# Patient Record
Sex: Female | Born: 1950 | Race: White | Hispanic: No | Smoking: Never smoker
Health system: Southern US, Community
[De-identification: ages and names within clinical notes are randomized; demographics above are authoritative.]

## PROBLEM LIST (undated history)

## (undated) DIAGNOSIS — K219 Gastro-esophageal reflux disease without esophagitis: Secondary | ICD-10-CM

## (undated) DIAGNOSIS — F0280 Dementia in other diseases classified elsewhere without behavioral disturbance: Secondary | ICD-10-CM

## (undated) DIAGNOSIS — R519 Headache, unspecified: Secondary | ICD-10-CM

## (undated) DIAGNOSIS — G309 Alzheimer's disease, unspecified: Secondary | ICD-10-CM

## (undated) DIAGNOSIS — R51 Headache: Secondary | ICD-10-CM

## (undated) HISTORY — DX: Gastro-esophageal reflux disease without esophagitis: K21.9

## (undated) HISTORY — DX: Headache: R51

## (undated) HISTORY — DX: Headache, unspecified: R51.9

---

## 2006-04-26 ENCOUNTER — Ambulatory Visit: Payer: Self-pay | Admitting: Pulmonary Disease

## 2006-05-20 ENCOUNTER — Ambulatory Visit: Payer: Self-pay

## 2016-12-04 ENCOUNTER — Encounter (HOSPITAL_COMMUNITY): Payer: Self-pay

## 2016-12-04 ENCOUNTER — Emergency Department (HOSPITAL_COMMUNITY)
Admission: EM | Admit: 2016-12-04 | Discharge: 2016-12-05 | Disposition: A | Discharge status: Home / Self Care | Payer: Self-pay | Attending: Emergency Medicine | Admitting: Emergency Medicine

## 2016-12-04 DIAGNOSIS — F0281 Dementia in other diseases classified elsewhere with behavioral disturbance: Secondary | ICD-10-CM | POA: Insufficient documentation

## 2016-12-04 DIAGNOSIS — K920 Hematemesis: Secondary | ICD-10-CM | POA: Insufficient documentation

## 2016-12-04 DIAGNOSIS — G309 Alzheimer's disease, unspecified: Secondary | ICD-10-CM | POA: Insufficient documentation

## 2016-12-04 HISTORY — DX: Dementia in other diseases classified elsewhere, unspecified severity, without behavioral disturbance, psychotic disturbance, mood disturbance, and anxiety: F02.80

## 2016-12-04 HISTORY — DX: Alzheimer's disease, unspecified: G30.9

## 2016-12-04 LAB — POC OCCULT BLOOD, ED: Fecal Occult Bld: NEGATIVE

## 2016-12-04 LAB — CBC WITH DIFFERENTIAL/PLATELET
BASOS ABS: 0 10*3/uL (ref 0.0–0.1)
Basophils Relative: 0 %
Eosinophils Absolute: 0.1 10*3/uL (ref 0.0–0.7)
Eosinophils Relative: 1 %
HCT: 35.7 % — ABNORMAL LOW (ref 36.0–46.0)
Hemoglobin: 12.4 g/dL (ref 12.0–15.0)
LYMPHS PCT: 21 %
Lymphs Abs: 2.2 10*3/uL (ref 0.7–4.0)
MCH: 31 pg (ref 26.0–34.0)
MCHC: 34.7 g/dL (ref 30.0–36.0)
MCV: 89.3 fL (ref 78.0–100.0)
MONO ABS: 0.5 10*3/uL (ref 0.1–1.0)
MONOS PCT: 4 %
Neutro Abs: 7.6 10*3/uL (ref 1.7–7.7)
Neutrophils Relative %: 74 %
PLATELETS: 222 10*3/uL (ref 150–400)
RBC: 4 MIL/uL (ref 3.87–5.11)
RDW: 12.9 % (ref 11.5–15.5)
WBC: 10.3 10*3/uL (ref 4.0–10.5)

## 2016-12-04 LAB — COMPREHENSIVE METABOLIC PANEL
ALT: 14 U/L (ref 14–54)
AST: 14 U/L — AB (ref 15–41)
Albumin: 3.5 g/dL (ref 3.5–5.0)
Alkaline Phosphatase: 101 U/L (ref 38–126)
Anion gap: 7 (ref 5–15)
BILIRUBIN TOTAL: 0.3 mg/dL (ref 0.3–1.2)
BUN: 11 mg/dL (ref 6–20)
CHLORIDE: 107 mmol/L (ref 101–111)
CO2: 25 mmol/L (ref 22–32)
CREATININE: 0.81 mg/dL (ref 0.44–1.00)
Calcium: 8.8 mg/dL — ABNORMAL LOW (ref 8.9–10.3)
GFR calc Af Amer: >60 mL/min (ref >60)
Glucose, Bld: 105 mg/dL — ABNORMAL HIGH (ref 65–99)
Potassium: 4.2 mmol/L (ref 3.5–5.1)
Sodium: 139 mmol/L (ref 135–145)
Total Protein: 6.7 g/dL (ref 6.5–8.1)

## 2016-12-04 LAB — TYPE AND SCREEN
ABO/RH(D): O POS
Antibody Screen: NEGATIVE

## 2016-12-04 LAB — I-STAT TROPONIN, ED: Troponin i, poc: 0 ng/mL (ref 0.00–0.08)

## 2016-12-04 LAB — OCCULT BLOOD GASTRIC / DUODENUM (SPECIMEN CUP): OCCULT BLOOD, GASTRIC: POSITIVE — AB

## 2016-12-04 LAB — ABO/RH: ABO/RH(D): O POS

## 2016-12-04 LAB — LIPASE, BLOOD: LIPASE: 22 U/L (ref 11–51)

## 2016-12-04 MED ORDER — PANTOPRAZOLE SODIUM 40 MG IV SOLR
40.0000 mg | Freq: Once | INTRAVENOUS | Status: AC
  Administered 2016-12-04: 40 mg via INTRAVENOUS
  Filled 2016-12-04: qty 40

## 2016-12-04 NOTE — ED Triage Notes (Signed)
Pt had 4 mg IV zofran enroute pt unable to explain what happened due to mental capacity

## 2016-12-04 NOTE — ED Provider Notes (Signed)
[facility]-EMERGENCY DEPT Provider Note   CSN: [id number] Arrival date & time: 12/04/16  1952     History   Chief Complaint Chief Complaint  Patient presents with  . Hematemesis    pt at home when she began to vomit blood     HPI Emily Curtis is a 66 y.o. female.  HPI Patient has Alzheimer's dementia. She was with family members at the dinner table. Her sister reports that she had eaten her dinner which was some chicken strips, green beans and Pepsi. She reports she suddenly started vomiting and at first the vomit looked like a brownish substance. She reports she vomited again and it looks like it was red and there was blood in it. Family members can't think of anything red that she had eaten with her dinner. This seemed to come out of the blue she apparently had been well before the symptoms. Patient is non-historian. She is alert but not oriented to her surroundings. Past Medical History:  Diagnosis Date  . Alzheimer's dementia     There are no active problems to display for this patient.   History reviewed. No pertinent surgical history.  OB History    No data available       Home Medications    Prior to Admission medications   Medication Sig Start Date End Date Taking? Authorizing Provider  pantoprazole (PROTONIX) 20 MG tablet Take 1 tablet (20 mg total) by mouth daily. 12/05/16   Arby BarretteMarcy Nevaeh Casillas, MD    Family History No family history on file.  Social History Social History  Substance Use Topics  . Smoking status: Never Smoker  . Smokeless tobacco: Never Used  . Alcohol use No     Allergies   Penicillins   Review of Systems Review of Systems  Cannot obtain review of systems level V caveat dementia. Physical Exam Updated Vital Signs BP 114/74   Pulse 97   Temp 99.7 F (37.6 C) (Oral)   Resp 17   Ht 5\' 9"  (1.753 m)   Wt 200 lb (90.7 kg)   SpO2 97%   BMI 29.53 kg/m   Physical Exam  Constitutional: She appears well-developed and well-nourished. No  distress.  Patient is mildly obese. She is alert and in no respiratory distress. Color is good.  HENT:  Head: Normocephalic and atraumatic.  Mouth/Throat: Oropharynx is clear and moist.  Eyes: Conjunctivae and EOM are normal.  Neck: Neck supple.  Cardiovascular: Normal rate and regular rhythm.   No murmur heard. Pulmonary/Chest: Effort normal and breath sounds normal. No respiratory distress.  Abdominal: Soft. She exhibits no distension. There is tenderness.  Patient seems to have some tenderness to palpation in the epigastrium. There is no guarding.  Genitourinary:  Genitourinary Comments: Rectal exam: Stool is brownish yellow. No melena.  Musculoskeletal: She exhibits no edema or tenderness.  Neurological: She is alert.  Patient is cheerful and follows most instructions. No focal motor deficits.  Skin: Skin is warm and dry.  Psychiatric: She has a normal mood and affect.  Nursing note and vitals reviewed.    ED Treatments / Results  Labs (all labs ordered are listed, but only abnormal results are displayed) Labs Reviewed  COMPREHENSIVE METABOLIC PANEL - Abnormal; Notable for the following:       Result Value   Glucose, Bld 105 (*)    Calcium 8.8 (*)    AST 14 (*)    All other components within normal limits  CBC WITH DIFFERENTIAL/PLATELET - Abnormal; Notable for  the following:    HCT 35.7 (*)    All other components within normal limits  OCCULT BLOOD GASTRIC / DUODENUM (SPECIMEN CUP) - Abnormal; Notable for the following:    Occult Blood, Gastric POSITIVE (*)    All other components within normal limits  LIPASE, BLOOD  URINALYSIS, ROUTINE W REFLEX MICROSCOPIC  I-STAT TROPOININ, ED  POC OCCULT BLOOD, ED  POCT GASTRIC OCCULT BLOOD (1-CARD TO LAB)  TYPE AND SCREEN  ABO/RH    EKG  EKG Interpretation  Date/Time:  Tuesday December 04 2016 19:58:25 EST Ventricular Rate:  104 PR Interval:    QRS Duration: 70 QT Interval:  342 QTC Calculation: 450 R Axis:   31 Text  Interpretation:  Sinus tachycardia agree. otherwise normal Confirmed by Donnald Garre, MD, Lebron Conners [phone]) on 12/04/2016 8:57:11 PM       Radiology No results found.  Procedures Procedures (including critical care time)  Medications Ordered in ED Medications  pantoprazole (PROTONIX) injection 40 mg (40 mg Intravenous Given 12/04/16 2229)     Initial Impression / Assessment and Plan / ED Course  I have reviewed the triage vital signs and the nursing notes.  Pertinent labs & imaging results that were available during my care of the patient were reviewed by me and considered in my medical decision making (see chart for details).  Clinical Course      Final Clinical Impressions(s) / ED Diagnoses   Final diagnoses:  Hematemesis without nausea   Patient had an episode of vomiting that family members thought was blood. They are uncertain but cannot recall her having anything red looking. There was a small streak of this material on her sock. This is more like salmon pink material that it did not look like blood. This however was a fairly small amount. Patient does not have apparent risk factors for upper GI bleeding. Patient does not show signs of anemia, and no recurrence of emesis. At this time patient will be started on daily Protonix and family members will continue to observe. They're counseled if there is any recurrence of emesis that looks like blood they are to return, otherwise patient will follow up with her primary care doctor to monitor her blood counts likely schedule upper endoscopy. New Prescriptions New Prescriptions   PANTOPRAZOLE (PROTONIX) 20 MG TABLET    Take 1 tablet (20 mg total) by mouth daily.     Arby Barrette, MD 12/05/16 [phone]

## 2016-12-04 NOTE — ED Notes (Signed)
This RN placed pt on the bedpan in an attempt to collect urine. Attempt was unsuccessful. Will attempt again shortly.

## 2016-12-05 MED ORDER — PANTOPRAZOLE SODIUM 20 MG PO TBEC: 20.0000 mg | DELAYED_RELEASE_TABLET | Freq: Every day | ORAL | 0 refills | Status: AC

## 2016-12-05 NOTE — ED Notes (Signed)
Pt stable, ambulatory, states understanding of discharge instructions 

## 2016-12-05 NOTE — Discharge Instructions (Signed)
Take Protonix daily as prescribed. Return if you have repeat episode of vomiting that looks like blood.

## 2016-12-18 ENCOUNTER — Ambulatory Visit (INDEPENDENT_AMBULATORY_CARE_PROVIDER_SITE_OTHER): Payer: Self-pay | Admitting: Family Medicine

## 2016-12-18 ENCOUNTER — Encounter: Payer: Self-pay | Admitting: Family Medicine

## 2016-12-18 VITALS — BP 130/80 | HR 92 | Resp 12 | Ht 69.0 in | Wt 186.5 lb

## 2016-12-18 DIAGNOSIS — Z23 Encounter for immunization: Secondary | ICD-10-CM

## 2016-12-18 DIAGNOSIS — F02818 Dementia in other diseases classified elsewhere, unspecified severity, with other behavioral disturbance: Secondary | ICD-10-CM

## 2016-12-18 DIAGNOSIS — G309 Alzheimer's disease, unspecified: Secondary | ICD-10-CM | POA: Insufficient documentation

## 2016-12-18 DIAGNOSIS — G300 Alzheimer's disease with early onset: Secondary | ICD-10-CM

## 2016-12-18 DIAGNOSIS — F0281 Dementia in other diseases classified elsewhere with behavioral disturbance: Secondary | ICD-10-CM

## 2016-12-18 DIAGNOSIS — G4700 Insomnia, unspecified: Secondary | ICD-10-CM | POA: Insufficient documentation

## 2016-12-18 MED ORDER — DONEPEZIL HCL 5 MG PO TABS: 5.0000 mg | ORAL_TABLET | Freq: Every day | ORAL | 1 refills | Status: DC

## 2016-12-18 MED ORDER — TRAZODONE HCL 50 MG PO TABS: 25.0000 mg | ORAL_TABLET | Freq: Every evening | ORAL | 1 refills | Status: DC | PRN

## 2016-12-18 NOTE — Progress Notes (Signed)
HPI:   Emily Curtis is a 66 y.o. female, who is here today with her sister and nephew to establish care with me.  Former PCP: Dr Mardene SpeakHubler.  Sister provides all information.  She lives with sister, moved here from [place] after her husband died in 06/2016. Dx with Alzheimer's disease in 2016.  According to sister, she was noticing memory issues when she talked with her on the phone 1-2 years prior to the diagnosis. Problems is worse, sister would like something that "calm her down" and "put her asleep", she is having difficulty controlling behavior, she tries to leave the house and becomes frustrated when she is not allowed to do so, "anxiety." Nephew is very annoyed by this situation, according to him, she asks same question every 2 minutes.  She cannot remember new information and also she does not remember her parents or brother's names. In regard to ADL;s, sister states that She needs assistance with "every thing right now", mainly bathing,grooming, and dressing. She does not need assistance for walking or eating.  Sister mentions that for years she was having headaches, she was evaluated by neurologists and was told she has had "mini-strokes", she has had brain MRI (12 years ago and last year).  She is not longer complaining of headaches. She eats well, according to sister, she eats frequently during the day. She sometimes has difficulty sleeping, 2-3 times per week.  No falls in the past year and denies depression symptoms.   Brain MRI 11/18/16:  Unenhanced sequences reveal no mass lesion, acute parenchymal hemorrhage or acute infarction.  Posterior fossa and sinuses unremarkable.  Moderate to severe atrophy is present particularly involving the temporal lobes. Moderate prolonged T2 signal periventricular white matter.     Review of Systems  Constitutional: Negative for activity change, appetite change, fatigue and fever.  HENT: Negative for mouth sores,  nosebleeds and trouble swallowing.   Eyes: Negative for redness and visual disturbance.  Respiratory: Negative for cough, shortness of breath and wheezing.   Cardiovascular: Negative for chest pain, palpitations and leg swelling.  Gastrointestinal: Negative for abdominal pain, nausea and vomiting.       Negative for changes in bowel habits.  Genitourinary: Negative for decreased urine volume, difficulty urinating, dysuria and hematuria.  Musculoskeletal: Negative for gait problem.  Skin: Negative for rash.  Neurological: Negative for seizures, syncope, weakness and headaches.  Psychiatric/Behavioral: Positive for behavioral problems, confusion and sleep disturbance. Negative for agitation, hallucinations and self-injury. The patient is nervous/anxious.       Current Outpatient Prescriptions on File Prior to Visit  Medication Sig Dispense Refill  . pantoprazole (PROTONIX) 20 MG tablet Take 1 tablet (20 mg total) by mouth daily. 30 tablet 0   No current facility-administered medications on file prior to visit.      Past Medical History:  Diagnosis Date  . Alzheimer's dementia   . Frequent headaches   . GERD (gastroesophageal reflux disease)    Allergies  Allergen Reactions  . Penicillins     Family History  Problem Relation Age of Onset  . Cancer Mother   . Heart disease Mother   . Stroke Mother   . Hypertension Mother   . Heart disease Father   . Stroke Father   . Hypertension Father     Social History   Social History  . Marital status: Widowed    Spouse name: N/A  . Number of children: N/A  . Years of education: N/A  Social History Main Topics  . Smoking status: Never Smoker  . Smokeless tobacco: Never Used  . Alcohol use No  . Drug use: Unknown  . Sexual activity: Not Asked   Other Topics Concern  . None   Social History Narrative  . None    Vitals:   12/18/16 1303  BP: 130/80  Pulse: 92  Resp: 12    Body mass index is 27.54  kg/m.   Physical Exam  Nursing note and vitals reviewed. Constitutional: She appears well-developed and well-nourished. She is cooperative. She does not appear ill. No distress.  HENT:  Head: Atraumatic.  Mouth/Throat: Oropharynx is clear and moist and mucous membranes are normal.  Eyes: Conjunctivae and EOM are normal. Pupils are equal, round, and reactive to light.  Neck: No tracheal deviation present. No thyroid mass and no thyromegaly present.  Cardiovascular: Normal rate and regular rhythm.   No murmur heard. Pulses:      Dorsalis pedis pulses are 2+ on the right side, and 2+ on the left side.  Respiratory: Effort normal and breath sounds normal. No respiratory distress.  GI: Soft. She exhibits no mass. There is no hepatomegaly. There is no tenderness.  Musculoskeletal: She exhibits no edema.  Lymphadenopathy:    She has no cervical adenopathy.  Neurological: She is alert. She has normal strength. No cranial nerve deficit. Coordination and gait normal.  Skin: Skin is warm. No erythema.  Psychiatric: She has a normal mood and affect.  Well groomed, smiling all the time, and good eye contact.      ASSESSMENT AND PLAN:     Kina was seen today for establish care.  Diagnoses and all orders for this visit:   Insomnia, unspecified type  Good sleep hygiene as possible. We discussed a few treatment options. Because sister also reporting "anxiety", Trazodone was recommended. We discussed some side effects. Fall precautions. F/U in 3-4 weeks, before if needed.  -     traZODone (DESYREL) 50 MG tablet; Take 0.5-1 tablets (25-50 mg total) by mouth at bedtime as needed for sleep.  Early onset Alzheimer's disease with behavioral disturbance  Sister reporting Hx of Alzheimer's but she also has Hx of CVD, ? Combination type dementia. No hallucinations or Hx of alcohol abuse. Education about Dx and treatment provided today. Eventually family needs to make arrangements for  care, it does not seem like family is dealing well with her disease. Sister agrees with trying Aricept, starting 5 mg.Some side effects discussed. F/U in 3-4 weeks.  -     donepezil (ARICEPT) 5 MG tablet; Take 1 tablet (5 mg total) by mouth at bedtime.  Need for prophylactic vaccination and inoculation against influenza -     Flu Vaccine QUAD 36+ mos IM    Labs to be considered next OV, currently she does not have health insurance.    Betty G. [place], MD  [facility]. [place] office.

## 2016-12-18 NOTE — Patient Instructions (Signed)
A few things to remember from today's visit:   Early onset Alzheimer's disease with behavioral disturbance - Plan: donepezil (ARICEPT) 5 MG tablet  Insomnia, unspecified type - Plan: traZODone (DESYREL) 50 MG tablet  Fall prevention.  Monitor for side effects.    Please be sure medication list is accurate. If a new problem present, please set up appointment sooner than planned today.        Alzheimer Disease Alzheimer disease is a brain disease that affects memory, thinking, and behavior. People with Alzheimer disease lose mental abilities, and the disease gets worse over time. Survival with Alzheimer disease ranges from several years to as long as 20 years. What are the causes? This condition develops when a protein called beta-amyloid forms deposits in the brain. It is not known what causes these deposits to form. What increases the risk? This condition is more likely to develop in people who:  Are elderly.  Have a family history of dementia.  Have had a brain injury.  Have heart or blood vessel disease.  Have had a stroke.  Have high blood pressure or high cholesterol.  Have diabetes. What are the signs or symptoms? Symptoms of this condition happen in three stages, which often overlap. Early stage In this stage, you may continue to be independent. You may still be able to drive, work, and be social. Symptoms in this stage include:  Minor memory problems, such as forgetting a name or what you read.  Difficulty with:  Paying attention.  Communicating.  Doing familiar tasks.  Learning new things.  Needing more time to do daily activities.  Anxiety.  Social withdrawal.  Loss of motivation. Moderate stage In this stage, you will start to need care. This stage usually lasts the longest. Symptoms in this stage include:  Difficulty with expressing thoughts.  Memory loss that affects daily life. This can include forgetting:  Your address or phone  number.  Events that have happened.  Parts of your personal history, like where you went to school.  Confusion about where you are or what time it is.  Difficulty in judging distance.  Changes in personality, mood, and behavior. You may be moody, irritable, angry, frustrated, fearful, anxious, or suspicious.  Poor reasoning and judgment.  Delusions or hallucinations.  Changes in sleep patterns.  Wandering and getting lost. Severe stage In the final stage, you will need help with your personal care and dailyactivities. Symptoms in this stage include:  Worsening memory loss.  Personality changes.  Loss of awareness of your surroundings.  Changes in physical abilities, including the ability to walk, sit, and swallow.  Difficulty in communicating.  Inability to control the bladder and bowels.  Increasing confusion.  Increasing disruptive behavior. How is this diagnosed? This condition is diagnosed with an assessment by your health care provider. During this assessment, your health care provider will talk with you and your family, friends, or caregivers about your symptoms. A thorough medical history will be taken, and you will have a physical exam and tests. Tests may include:  Lab tests, such as blood or urine tests.  Imaging tests, such as a CT scan, PET scan, or MRI.  A lumbar puncture. This test involves removing and testing a small amount of the fluid that surrounds the brain and spinal cord.  An electroencephalogram (EEG). In this test, small metal discs are used to measure electrical activity in the brain.  Memory tests, cognitive tests, and neuropsychological tests. These tests evaluate brain function. How is  this treated? At this time, there is no treatment to cure Alzheimer disease or stop it from getting worse. The goals of treatment are:  To slow down the disease.  To manage behavioral problems.  To provide you with a safe environment.  To make life  easier for you and your caregivers. The following treatment options are available:  Medicines. Medicines may help to slow down memory loss and control behavioral symptoms.  Talk therapy. Talk therapy provides you with education, support, and memory aids. It is most helpful in the early stages of the condition.  Counseling or spiritual guidance. It is normal to have a lot of feelings, including anger, relief, fear, and isolation. Counseling and guidance can help you deal with these feelings.  Caregiving. This involves having caregivers help you with your daily activities. Caregivers may be family members, friends, or trained medical professionals. Caregiving can be done at home or outside the home.  Family support groups. These provide education, emotional support, and information about community resources to family members who are taking care of you. Follow these instructions at home: Medicines  Take over-the-counter and prescription medicines only as told by your health care provider.  Avoid taking medicines that can affect thinking, such as pain or sleeping medicines. Lifestyle  Make healthy lifestyle choices:  Be physically active as told by your health care provider.  Do not use any tobacco products, such as cigarettes, chewing tobacco, and e-cigarettes. If you need help quitting, ask your health care provider.  Eat a healthy diet.  Practice stress-management techniques when you get stressed.  Stay social.  Drink enough fluid to keep your urine clear or pale yellow.  Make sure to get quality sleep. These tips can help you get a good night's rest:  Avoid napping during the day.  Keep your sleeping area dark and cool.  Avoid exercising during the few hours before you go to bed.  Avoid caffeine products in the evening. General instructions  Work with your health care provider to determine what you need help with and what your safety needs are.  If you were given a  bracelet that tracks your location, make sure to wear it.  Keep all follow-up visits as told by your health care provider. This is important.  If you have questions or would like additional support, you may contact The Alzheimer's Association:  24-hour helpline: 774-114-8228  Website: LimitLaws.hu Contact a health care provider if:  You have nausea, vomiting, or trouble with eating.  You have dizziness, or weakness.  You have new or worsening trouble with sleeping.  You or your family members become concerned for your safety. Get help right away if:  You develop chest pain or difficulty with breathing.  You pass out. This information is not intended to replace advice given to you by your health care provider. Make sure you discuss any questions you have with your health care provider. Document Released: 07/24/2004 Document Revised: 07/13/2016 Document Reviewed: 08/10/2015 Elsevier Interactive Patient Education  2017 [employer].

## 2016-12-18 NOTE — Progress Notes (Signed)
Pre visit review using our clinic review tool, if applicable. No additional management support is needed unless otherwise documented below in the visit note. 

## 2017-01-17 NOTE — Progress Notes (Signed)
HPI:   [name] is a 66 y.o. female, who is here today with her sister and nephew to follow on recent OV.   She was seen on 12/18/16 when sister was reporting Dx of Alzheimer's disease. Sister and nephew were here with her last OV and both requesting medications to keep her calm, she had tried to leave the house a few times and getting upset when she was not allowed to do so.  Last OV Aricept 5 mg and Trazodone were added.  Her sister did not start Trazodone because she is sleeping well at night. Aricept 5 mg has ben well tolerated.  She had diarrhea and viral like symptoms a couple weeks ago,resolved. There are no new concerns today.  Her sister is working on her disability and trying to keep her Medicaid. She is also trying to look for a place Ms Emily Curtis can live with assistance.She cannot take care of her, she needs help with most ADL's. Nephew is aggravated by the fact she cannot clear herself after defecation and by vocal noises she makes frequently. Symptoms are stable in general. No episodes of agitation.  No new concerns today.   Review of Systems  Constitutional: Negative for activity change, appetite change and fatigue.  HENT: Negative for mouth sores, trouble swallowing and voice change.   Respiratory: Negative for cough, shortness of breath and wheezing.   Cardiovascular: Negative for chest pain, palpitations and leg swelling.  Gastrointestinal: Negative for abdominal pain, nausea and vomiting.  Skin: Negative for rash.  Neurological: Negative for syncope, weakness and headaches.  Psychiatric/Behavioral: Positive for behavioral problems and confusion. Negative for agitation and hallucinations.      Current Outpatient Prescriptions on File Prior to Visit  Medication Sig Dispense Refill  . pantoprazole (PROTONIX) 20 MG tablet Take 1 tablet (20 mg total) by mouth daily. 30 tablet 0  . traZODone (DESYREL) 50 MG tablet Take 0.5-1 tablets (25-50 mg  total) by mouth at bedtime as needed for sleep. 30 tablet 1   No current facility-administered medications on file prior to visit.      Past Medical History:  Diagnosis Date  . Alzheimer's dementia   . Frequent headaches   . GERD (gastroesophageal reflux disease)    Allergies  Allergen Reactions  . Penicillins     Social History   Social History  . Marital status: Widowed    Spouse name: N/A  . Number of children: N/A  . Years of education: N/A   Social History Main Topics  . Smoking status: Never Smoker  . Smokeless tobacco: Never Used  . Alcohol use No  . Drug use: Unknown  . Sexual activity: Not Asked   Other Topics Concern  . None   Social History Narrative  . None    Vitals:   01/18/17 1419  BP: 120/70  Pulse: 87  Resp: 12   Body mass index is 28.37 kg/m.   Physical Exam  Nursing note and vitals reviewed. Constitutional: She appears well-developed and well-nourished. She is cooperative. She does not appear ill. No distress.  HENT:  Head: Atraumatic.  Mouth/Throat: Oropharynx is clear and moist and mucous membranes are normal.  Eyes: Conjunctivae and EOM are normal.  Cardiovascular: Normal rate and regular rhythm.   No murmur heard. Respiratory: Effort normal and breath sounds normal. No respiratory distress.  GI: Soft. She exhibits no mass. There is no hepatomegaly. There is no tenderness.  Musculoskeletal: She exhibits edema (trace pitting edema  LE,bilateral). She exhibits no tenderness.  Neurological: She is alert. She has normal strength. Gait normal.  Skin: Skin is warm. No erythema.  Psychiatric: She has a normal mood and affect.  Well groomed, smiling, cooperative,and good eye contact.      ASSESSMENT AND PLAN:     [name] was seen today for follow-up.  Diagnoses and all orders for this visit:  Alzheimer's dementia with behavioral disturbance, unspecified timing of dementia onset -     donepezil (ARICEPT) 10 MG tablet; Take 1  tablet (10 mg total) by mouth at bedtime.  We discussed the most recent brain MRI result. Natural progression of disease and treatment options as well as effectiveness of available medications. She needs assistance with AD's, her sister will let me know if Medicaid is approved. It seems like she tolerate Aricept well, so dose increased from 5 to 10 mg.Planning on adding Namenda and Vit E next OV. Side effects discussed. She needs to be under supervision 24/7. Will consider lab work once she has health insurance.According to sister she has been evaluated by neuro in the past and has had blood work done.    Betty G. [place], MD  [facility]. [place] office.

## 2017-01-18 ENCOUNTER — Ambulatory Visit (INDEPENDENT_AMBULATORY_CARE_PROVIDER_SITE_OTHER): Payer: Self-pay | Admitting: Family Medicine

## 2017-01-18 ENCOUNTER — Encounter: Payer: Self-pay | Admitting: Family Medicine

## 2017-01-18 VITALS — BP 120/70 | HR 87 | Resp 12 | Ht 69.0 in | Wt 192.1 lb

## 2017-01-18 DIAGNOSIS — G308 Other Alzheimer's disease: Secondary | ICD-10-CM

## 2017-01-18 DIAGNOSIS — F0281 Dementia in other diseases classified elsewhere with behavioral disturbance: Secondary | ICD-10-CM

## 2017-01-18 DIAGNOSIS — G309 Alzheimer's disease, unspecified: Principal | ICD-10-CM

## 2017-01-18 MED ORDER — DONEPEZIL HCL 10 MG PO TABS: 10.0000 mg | ORAL_TABLET | Freq: Every day | ORAL | 0 refills | Status: DC

## 2017-01-18 NOTE — Patient Instructions (Signed)
A few things to remember from today's visit:   Alzheimer's dementia with behavioral disturbance, unspecified timing of dementia onset - Plan: donepezil (ARICEPT) 10 MG tablet  Monitor for diarrhea or other side effects.  Please be sure medication list is accurate. If a new problem present, please set up appointment sooner than planned today.

## 2017-01-18 NOTE — Progress Notes (Signed)
Pre visit review using our clinic review tool, if applicable. No additional management support is needed unless otherwise documented below in the visit note. 

## 2017-01-21 ENCOUNTER — Encounter: Payer: Self-pay | Admitting: Family Medicine

## 2017-04-29 ENCOUNTER — Other Ambulatory Visit: Payer: Self-pay | Admitting: Family Medicine

## 2017-04-29 DIAGNOSIS — F0281 Dementia in other diseases classified elsewhere with behavioral disturbance: Secondary | ICD-10-CM

## 2017-04-29 DIAGNOSIS — G309 Alzheimer's disease, unspecified: Principal | ICD-10-CM

## 2017-05-21 ENCOUNTER — Ambulatory Visit: Payer: Self-pay | Admitting: Family Medicine

## 2017-06-05 ENCOUNTER — Ambulatory Visit: Payer: Self-pay | Admitting: Family Medicine

## 2017-07-01 ENCOUNTER — Emergency Department (HOSPITAL_COMMUNITY): Payer: Self-pay

## 2017-07-01 ENCOUNTER — Emergency Department (HOSPITAL_COMMUNITY)
Admission: EM | Admit: 2017-07-01 | Discharge: 2017-07-01 | Disposition: A | Discharge status: Home / Self Care | Payer: Self-pay | Attending: Emergency Medicine | Admitting: Emergency Medicine

## 2017-07-01 ENCOUNTER — Encounter (HOSPITAL_COMMUNITY): Payer: Self-pay | Admitting: Emergency Medicine

## 2017-07-01 DIAGNOSIS — Y92009 Unspecified place in unspecified non-institutional (private) residence as the place of occurrence of the external cause: Secondary | ICD-10-CM | POA: Insufficient documentation

## 2017-07-01 DIAGNOSIS — Y9301 Activity, walking, marching and hiking: Secondary | ICD-10-CM | POA: Insufficient documentation

## 2017-07-01 DIAGNOSIS — E860 Dehydration: Secondary | ICD-10-CM | POA: Insufficient documentation

## 2017-07-01 DIAGNOSIS — W19XXXA Unspecified fall, initial encounter: Secondary | ICD-10-CM | POA: Insufficient documentation

## 2017-07-01 DIAGNOSIS — Z79899 Other long term (current) drug therapy: Secondary | ICD-10-CM | POA: Insufficient documentation

## 2017-07-01 DIAGNOSIS — G309 Alzheimer's disease, unspecified: Secondary | ICD-10-CM | POA: Insufficient documentation

## 2017-07-01 DIAGNOSIS — Y999 Unspecified external cause status: Secondary | ICD-10-CM | POA: Insufficient documentation

## 2017-07-01 LAB — CBC WITH DIFFERENTIAL/PLATELET
Basophils Absolute: 0 K/uL (ref 0.0–0.1)
Basophils Relative: 0 %
Eosinophils Absolute: 0.1 K/uL (ref 0.0–0.7)
Eosinophils Relative: 1 %
HCT: 36.1 % (ref 36.0–46.0)
Hemoglobin: 12.4 g/dL (ref 12.0–15.0)
Lymphocytes Relative: 22 %
Lymphs Abs: 2.2 K/uL (ref 0.7–4.0)
MCH: 30.3 pg (ref 26.0–34.0)
MCHC: 34.3 g/dL (ref 30.0–36.0)
MCV: 88.3 fL (ref 78.0–100.0)
Monocytes Absolute: 0.6 K/uL (ref 0.1–1.0)
Monocytes Relative: 6 %
Neutro Abs: 7.3 K/uL (ref 1.7–7.7)
Neutrophils Relative %: 71 %
Platelets: 158 K/uL (ref 150–400)
RBC: 4.09 MIL/uL (ref 3.87–5.11)
RDW: 12.6 % (ref 11.5–15.5)
WBC: 10.2 K/uL (ref 4.0–10.5)

## 2017-07-01 LAB — URINALYSIS, MICROSCOPIC (REFLEX)
Bacteria, UA: NONE SEEN
Squamous Epithelial / HPF: NONE SEEN

## 2017-07-01 LAB — COMPREHENSIVE METABOLIC PANEL
ALT: 20 U/L (ref 14–54)
AST: 26 U/L (ref 15–41)
Albumin: 3.5 g/dL (ref 3.5–5.0)
Alkaline Phosphatase: 84 U/L (ref 38–126)
Anion gap: 10 (ref 5–15)
BUN: 12 mg/dL (ref 6–20)
CHLORIDE: 105 mmol/L (ref 101–111)
CO2: 22 mmol/L (ref 22–32)
CREATININE: 0.93 mg/dL (ref 0.44–1.00)
Calcium: 8.6 mg/dL — ABNORMAL LOW (ref 8.9–10.3)
Glucose, Bld: 117 mg/dL — ABNORMAL HIGH (ref 65–99)
POTASSIUM: 3.1 mmol/L — AB (ref 3.5–5.1)
Sodium: 137 mmol/L (ref 135–145)
TOTAL PROTEIN: 6.6 g/dL (ref 6.5–8.1)
Total Bilirubin: 0.5 mg/dL (ref 0.3–1.2)

## 2017-07-01 LAB — URINALYSIS, ROUTINE W REFLEX MICROSCOPIC
Bilirubin Urine: NEGATIVE
Glucose, UA: NEGATIVE mg/dL
Ketones, ur: 15 mg/dL — AB
Leukocytes, UA: NEGATIVE
Nitrite: NEGATIVE
Protein, ur: NEGATIVE mg/dL
Specific Gravity, Urine: 1.015 (ref 1.005–1.030)
pH: 6 (ref 5.0–8.0)

## 2017-07-01 LAB — I-STAT CG4 LACTIC ACID, ED
Lactic Acid, Venous: 3.22 mmol/L (ref 0.5–1.9)
Lactic Acid, Venous: 1.43 mmol/L (ref 0.5–1.9)

## 2017-07-01 MED ORDER — SODIUM CHLORIDE 0.9 % IV BOLUS (SEPSIS)
1000.0000 mL | Freq: Once | INTRAVENOUS | Status: AC
  Administered 2017-07-01: 1000 mL via INTRAVENOUS

## 2017-07-01 NOTE — ED Notes (Signed)
Patient transported to CT 

## 2017-07-01 NOTE — ED Notes (Signed)
ED Provider at bedside. 

## 2017-07-01 NOTE — ED Triage Notes (Signed)
Pt BIB [facility] from home where pt lives with family. Family reports pt was walking today and her legs gave out. Family reports they assisted pt to floor. Pt vomited after fall. Upon EMS arrival pt still on floor they report BP was 160s palpable until they sat the patient up at which point EMS reports pt became diaphoretic with BP dropping to 88/40. EMS also reports once patient in truck HR 40-50s with prolonged QT. 500 CC fluid given PTA. Family also reported to EMS strong smelling urine. 92% RA, EMS place on 3L [place] brought pt to 98%.

## 2017-07-01 NOTE — ED Provider Notes (Signed)
[facility]-EMERGENCY DEPT Provider Note   CSN: [id number] Arrival date & time: 07/01/17  0038     History   Chief Complaint Chief Complaint  Patient presents with  . Loss of Consciousness  . Fall    HPI Emily Curtis is a 66 y.o. female.  Patient presents to the emergency department from home by ambulance. Patient had a fall at home. Family reports that she was walking in her legs gave out. Family was able to partially catch her, but she did hit her head on the wall before she was laid to the ground by family members. There was no loss of consciousness. Patient has advanced Alzheimer's, is nonverbal at baseline.  EMS report that the patient was still on the floor upon their arrival. Her vital signs were normal with slightly elevated blood pressure initially, but when they sat her up she became diaphoretic and blood pressure dropped to 80/40. Patient was given 500 cc of saline solution during transport with improvement of blood pressure.  Level V Caveat due to dementia.      Past Medical History:  Diagnosis Date  . Alzheimer's dementia   . Frequent headaches   . GERD (gastroesophageal reflux disease)     Patient Active Problem List   Diagnosis Date Noted  . Alzheimer's dementia with behavioral disturbance 12/18/2016  . 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 No pertinent surgical history.  OB History    No data available       Home Medications    Prior to Admission medications   Medication Sig Start Date End Date Taking? Authorizing Provider  cetirizine (ZYRTEC) 10 MG tablet Take 10 mg by mouth daily as needed for allergies.   Yes [provider]  donepezil (ARICEPT) 10 MG tablet TAKE 1 TABLET BY MOUTH AT BEDTIME 04/29/17  Yes [place], Betty G, MD  pantoprazole (PROTONIX) 20 MG tablet Take 1 tablet (20 mg total) by mouth daily. 12/05/16  Yes Arby Barrette, MD  ranitidine (ZANTAC) 150 MG tablet Take 150 mg by mouth 2 (two) times daily as needed for  heartburn.   Yes [provider]  traZODone (DESYREL) 50 MG tablet Take 0.5-1 tablets (25-50 mg total) by mouth at bedtime as needed for sleep. 12/18/16  Yes [place], Betty G, MD    Family History Family History  Problem Relation Age of Onset  . Cancer Mother   . Heart disease Mother   . Stroke Mother   . Hypertension Mother   . Heart disease Father   . Stroke Father   . Hypertension Father     Social History Social History  Substance Use Topics  . Smoking status: Never Smoker  . Smokeless tobacco: Never Used  . Alcohol use No     Allergies   Penicillins   Review of Systems Review of Systems  Unable to perform ROS: Dementia     Physical Exam Updated Vital Signs BP 130/70   Pulse 66   Temp (!) 97.5 F (36.4 C) (Oral)   Resp 18   SpO2 98%   Physical Exam  Constitutional: She appears well-developed and well-nourished. No distress.  HENT:  Head: Normocephalic.  Right Ear: Hearing normal.  Left Ear: Hearing normal.  Nose: Nose normal.  Mouth/Throat: Oropharynx is clear and moist and mucous membranes are normal.  0.5cm mole right forehead, bleeding  Eyes: Pupils are equal, round, and reactive to light. Conjunctivae and EOM are normal.  Neck: Normal range of motion. Neck supple.  Cardiovascular:  Regular rhythm, S1 normal and S2 normal.  Exam reveals no gallop and no friction rub.   No murmur heard. Pulmonary/Chest: Effort normal and breath sounds normal. No respiratory distress. She exhibits no tenderness.  Abdominal: Soft. Normal appearance and bowel sounds are normal. There is no hepatosplenomegaly. There is no tenderness. There is no rebound, no guarding, no tenderness at McBurney's point and negative Murphy's sign. No hernia.  Musculoskeletal: Normal range of motion.  Neurological: She is alert. She has normal strength. No cranial nerve deficit or sensory deficit. Coordination normal.  Skin: Skin is warm, dry and intact. No rash noted. No cyanosis.    Abrasion, hematoma right forehead at site of large mole  Psychiatric: Thought content normal. She is noncommunicative.  Nursing note and vitals reviewed.    ED Treatments / Results  Labs (all labs ordered are listed, but only abnormal results are displayed) Labs Reviewed  COMPREHENSIVE METABOLIC PANEL - Abnormal; Notable for the following:       Result Value   Potassium 3.1 (*)    Glucose, Bld 117 (*)    Calcium 8.6 (*)    All other components within normal limits  URINALYSIS, ROUTINE W REFLEX MICROSCOPIC - Abnormal; Notable for the following:    Hgb urine dipstick TRACE (*)    Ketones, ur 15 (*)    All other components within normal limits  I-STAT CG4 LACTIC ACID, ED - Abnormal; Notable for the following:    Lactic Acid, Venous 3.22 (*)    All other components within normal limits  CBC WITH DIFFERENTIAL/PLATELET  URINALYSIS, MICROSCOPIC (REFLEX)  I-STAT CG4 LACTIC ACID, ED    EKG  EKG Interpretation  Date/Time:  Monday July 01 2017 00:48:44 EDT Ventricular Rate:  55 PR Interval:    QRS Duration: 97 QT Interval:  479 QTC Calculation: 459 R Axis:   0 Text Interpretation:  Sinus rhythm Low voltage, precordial leads Confirmed by Gilda Crease [phone]) on 07/01/2017 1:10:27 AM       Radiology Dg Chest 2 View  Result Date: 07/01/2017 CLINICAL DATA:  Status post fall, with vomiting. Confusion. Initial encounter. EXAM: CHEST  2 VIEW COMPARISON:  None. FINDINGS: The lungs are hypoexpanded. A small left pleural effusion is noted. There is no evidence of pneumothorax. The heart is borderline normal in size. No acute osseous abnormalities are seen. Clips are noted within the right upper quadrant, reflecting prior cholecystectomy. IMPRESSION: Lungs hypoexpanded. Small left pleural effusion noted. No displaced rib fracture seen. Electronically Signed   By: Roanna Raider M.D.   On: 07/01/2017 01:44   Dg Pelvis 1-2 Views  Result Date: 07/01/2017 CLINICAL DATA:  Status post  fall, with concern for pelvic injury. Initial encounter. EXAM: PELVIS - 1-2 VIEW COMPARISON:  None. FINDINGS: There is no evidence of fracture or dislocation. Both femoral heads are seated normally within their respective acetabula. No significant degenerative change is appreciated. The sacroiliac joints are unremarkable in appearance. The visualized bowel gas pattern is grossly unremarkable in appearance. IMPRESSION: No evidence of fracture or dislocation. Electronically Signed   By: Roanna Raider M.D.   On: 07/01/2017 01:45   Ct Head Wo Contrast  Result Date: 07/01/2017 CLINICAL DATA:  Status post fall, with vomiting. Concern for head injury. Initial encounter. EXAM: CT HEAD WITHOUT CONTRAST TECHNIQUE: Contiguous axial images were obtained from the base of the skull through the vertex without intravenous contrast. COMPARISON:  None. FINDINGS: Brain: No evidence of acute infarction, hemorrhage, hydrocephalus, extra-axial collection or mass lesion/mass  effect. Prominence of the ventricles and sulci reflects moderately severe cortical volume loss. Cerebellar atrophy is noted. Scattered periventricular and subcortical white matter change likely reflects small vessel ischemic microangiopathy. A chronic lacunar infarct is noted at the right pons. The fourth ventricle is within normal limits. The basal ganglia are unremarkable in appearance. The cerebral hemispheres demonstrate grossly normal gray-white differentiation. No mass effect or midline shift is seen. Vascular: No hyperdense vessel or unexpected calcification. Skull: There is no evidence of fracture; visualized osseous structures are unremarkable in appearance. Sinuses/Orbits: The orbits are within normal limits. There is mild partial opacification of the right maxillary sinus. The remaining paranasal sinuses and mastoid air cells are well-aerated. Other: No significant soft tissue abnormalities are seen. IMPRESSION: 1. No evidence of traumatic intracranial  injury or fracture. 2. Moderately severe cortical volume loss and scattered small vessel ischemic microangiopathy. 3. 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 4. Mild partial opacification of the right maxillary sinus. Electronically Signed   By: Jeffery  Chang M.D.   OnRoanna Raider: 07/01/2017 01:30    Procedures Procedures (including critical care time)  Medications Ordered in ED Medications  sodium chloride 0.9 % bolus 1,000 mL (0 mLs Intravenous Stopped 07/01/17 0450)     Initial Impression / Assessment and Plan / ED Course  I have reviewed the triage vital signs and the nursing notes.  Pertinent labs & imaging results that were available during my care of the patient were reviewed by me and considered in my medical decision making (see chart for details).     Patient presents to the emergency department for evaluation after a fall. Patient was reportedly walking earlier in her legs gave out. Family member was able to catch her but she did hit her head on the wall before she was lowered to the ground. She has small amount of bleeding from a raised mole on the right side of her forehead, no open wounds that required repair. CT head was unremarkable, no evidence of intracranial injury.  Reportedly was hypotensive and orthostatic for EMS initially. She was given a fluid bolus by EMS. Family reported foul-smelling urine, workup has been negative, however. No evidence of urinary tract infection, blood work normal except for elevated lactic acid. No evidence of infection, not related to sepsis. Patient given additional fluid bolus and lactate is now normal. She is not orthostatic here, able to get up without difficulty.  EMS reported concern about the living arrangements. We'll have case management and social work consultation for home health needs and possible placement in the future.  Final Clinical Impressions(s) / ED Diagnoses   Final diagnoses:  Dehydration  Fall, initial encounter    New  Prescriptions New Prescriptions   No medications on file     Gilda CreasePollina, Lynnsie Linders J, MD 07/01/17 [phone]

## 2017-11-17 ENCOUNTER — Other Ambulatory Visit: Payer: Self-pay | Admitting: Family Medicine

## 2017-11-17 DIAGNOSIS — G309 Alzheimer's disease, unspecified: Principal | ICD-10-CM

## 2017-11-17 DIAGNOSIS — F0281 Dementia in other diseases classified elsewhere with behavioral disturbance: Secondary | ICD-10-CM

## 2017-11-28 ENCOUNTER — Emergency Department (HOSPITAL_COMMUNITY): Payer: Medicaid Other

## 2017-11-28 ENCOUNTER — Other Ambulatory Visit: Payer: Self-pay

## 2017-11-28 ENCOUNTER — Encounter (HOSPITAL_COMMUNITY): Payer: Self-pay

## 2017-11-28 ENCOUNTER — Inpatient Hospital Stay (HOSPITAL_COMMUNITY)
Admission: EM | Admit: 2017-11-28 | Discharge: 2017-11-30 | DRG: 641 | Disposition: A | Discharge status: Home Health | Payer: Medicaid Other | Attending: Family Medicine | Admitting: Family Medicine

## 2017-11-28 DIAGNOSIS — F02818 Dementia in other diseases classified elsewhere, unspecified severity, with other behavioral disturbance: Secondary | ICD-10-CM | POA: Diagnosis present

## 2017-11-28 DIAGNOSIS — K219 Gastro-esophageal reflux disease without esophagitis: Secondary | ICD-10-CM

## 2017-11-28 DIAGNOSIS — E860 Dehydration: Principal | ICD-10-CM | POA: Diagnosis present

## 2017-11-28 DIAGNOSIS — D649 Anemia, unspecified: Secondary | ICD-10-CM

## 2017-11-28 DIAGNOSIS — W19XXXA Unspecified fall, initial encounter: Secondary | ICD-10-CM

## 2017-11-28 DIAGNOSIS — E872 Acidosis: Secondary | ICD-10-CM | POA: Diagnosis present

## 2017-11-28 DIAGNOSIS — E861 Hypovolemia: Secondary | ICD-10-CM

## 2017-11-28 DIAGNOSIS — F0281 Dementia in other diseases classified elsewhere with behavioral disturbance: Secondary | ICD-10-CM | POA: Diagnosis present

## 2017-11-28 DIAGNOSIS — G4700 Insomnia, unspecified: Secondary | ICD-10-CM | POA: Diagnosis present

## 2017-11-28 DIAGNOSIS — G309 Alzheimer's disease, unspecified: Secondary | ICD-10-CM | POA: Diagnosis present

## 2017-11-28 DIAGNOSIS — D638 Anemia in other chronic diseases classified elsewhere: Secondary | ICD-10-CM | POA: Diagnosis present

## 2017-11-28 DIAGNOSIS — Z66 Do not resuscitate: Secondary | ICD-10-CM | POA: Diagnosis present

## 2017-11-28 DIAGNOSIS — R55 Syncope and collapse: Secondary | ICD-10-CM

## 2017-11-28 DIAGNOSIS — E876 Hypokalemia: Secondary | ICD-10-CM | POA: Diagnosis present

## 2017-11-28 DIAGNOSIS — I959 Hypotension, unspecified: Secondary | ICD-10-CM | POA: Diagnosis present

## 2017-11-28 DIAGNOSIS — I9589 Other hypotension: Secondary | ICD-10-CM

## 2017-11-28 DIAGNOSIS — Z79899 Other long term (current) drug therapy: Secondary | ICD-10-CM

## 2017-11-28 DIAGNOSIS — G301 Alzheimer's disease with late onset: Secondary | ICD-10-CM

## 2017-11-28 DIAGNOSIS — R627 Adult failure to thrive: Secondary | ICD-10-CM | POA: Diagnosis present

## 2017-11-28 LAB — COMPREHENSIVE METABOLIC PANEL
ALBUMIN: 3 g/dL — AB (ref 3.5–5.0)
ALT: 11 U/L — ABNORMAL LOW (ref 14–54)
AST: 18 U/L (ref 15–41)
Alkaline Phosphatase: 77 U/L (ref 38–126)
Anion gap: 7 (ref 5–15)
BILIRUBIN TOTAL: 0.4 mg/dL (ref 0.3–1.2)
BUN: 7 mg/dL (ref 6–20)
CHLORIDE: 111 mmol/L (ref 101–111)
CO2: 21 mmol/L — ABNORMAL LOW (ref 22–32)
Calcium: 7.7 mg/dL — ABNORMAL LOW (ref 8.9–10.3)
Creatinine, Ser: 0.91 mg/dL (ref 0.44–1.00)
GFR calc Af Amer: >60 mL/min (ref >60)
GFR calc non Af Amer: >60 mL/min (ref >60)
Glucose, Bld: 137 mg/dL — ABNORMAL HIGH (ref 65–99)
POTASSIUM: 3.1 mmol/L — AB (ref 3.5–5.1)
Sodium: 139 mmol/L (ref 135–145)
TOTAL PROTEIN: 5.7 g/dL — AB (ref 6.5–8.1)

## 2017-11-28 LAB — CBC WITH DIFFERENTIAL/PLATELET
Basophils Absolute: 0 10*3/uL (ref 0.0–0.1)
Basophils Relative: 0 %
EOS PCT: 2 %
Eosinophils Absolute: 0.2 10*3/uL (ref 0.0–0.7)
HEMATOCRIT: 29.8 % — AB (ref 36.0–46.0)
Hemoglobin: 9.7 g/dL — ABNORMAL LOW (ref 12.0–15.0)
LYMPHS ABS: 1.3 10*3/uL (ref 0.7–4.0)
LYMPHS PCT: 17 %
MCH: 29 pg (ref 26.0–34.0)
MCHC: 32.6 g/dL (ref 30.0–36.0)
MCV: 89.2 fL (ref 78.0–100.0)
MONO ABS: 0.3 10*3/uL (ref 0.1–1.0)
Monocytes Relative: 4 %
NEUTROS ABS: 6 10*3/uL (ref 1.7–7.7)
Neutrophils Relative %: 77 %
PLATELETS: 188 10*3/uL (ref 150–400)
RBC: 3.34 MIL/uL — ABNORMAL LOW (ref 3.87–5.11)
RDW: 12.7 % (ref 11.5–15.5)
WBC: 7.8 10*3/uL (ref 4.0–10.5)

## 2017-11-28 LAB — I-STAT TROPONIN, ED: Troponin i, poc: 0 ng/mL (ref 0.00–0.08)

## 2017-11-28 LAB — URINALYSIS, ROUTINE W REFLEX MICROSCOPIC
BILIRUBIN URINE: NEGATIVE
Glucose, UA: NEGATIVE mg/dL
HGB URINE DIPSTICK: NEGATIVE
Ketones, ur: NEGATIVE mg/dL
Leukocytes, UA: NEGATIVE
Nitrite: NEGATIVE
Protein, ur: NEGATIVE mg/dL
SPECIFIC GRAVITY, URINE: 1.014 (ref 1.005–1.030)
pH: 6 (ref 5.0–8.0)

## 2017-11-28 LAB — I-STAT CG4 LACTIC ACID, ED
LACTIC ACID, VENOUS: 2.06 mmol/L — AB (ref 0.5–1.9)
Lactic Acid, Venous: 2.87 mmol/L (ref 0.5–1.9)

## 2017-11-28 LAB — TYPE AND SCREEN
ABO/RH(D): O POS
ANTIBODY SCREEN: NEGATIVE

## 2017-11-28 LAB — LACTIC ACID, PLASMA
LACTIC ACID, VENOUS: 1.1 mmol/L (ref 0.5–1.9)
Lactic Acid, Venous: 1.9 mmol/L (ref 0.5–1.9)

## 2017-11-28 MED ORDER — BISACODYL 10 MG RE SUPP: 10.0000 mg | Freq: Every day | RECTAL | Status: DC | PRN

## 2017-11-28 MED ORDER — ACETAMINOPHEN 325 MG PO TABS
650.0000 mg | ORAL_TABLET | Freq: Four times a day (QID) | ORAL | Status: DC | PRN
  Administered 2017-11-29 (×2): 650 mg via ORAL
  Filled 2017-11-28 (×2): qty 2

## 2017-11-28 MED ORDER — SENNOSIDES-DOCUSATE SODIUM 8.6-50 MG PO TABS: 1.0000 | ORAL_TABLET | Freq: Every evening | ORAL | Status: DC | PRN

## 2017-11-28 MED ORDER — ONDANSETRON HCL 4 MG/2ML IJ SOLN: 4.0000 mg | Freq: Four times a day (QID) | INTRAMUSCULAR | Status: DC | PRN

## 2017-11-28 MED ORDER — POTASSIUM CHLORIDE 10 MEQ/100ML IV SOLN
10.0000 meq | INTRAVENOUS | Status: AC
  Administered 2017-11-28 (×4): 10 meq via INTRAVENOUS
  Filled 2017-11-28 (×4): qty 100

## 2017-11-28 MED ORDER — TRAZODONE HCL 50 MG PO TABS
25.0000 mg | ORAL_TABLET | Freq: Every evening | ORAL | Status: DC | PRN
Start: 2017-11-28 — End: 2017-11-28

## 2017-11-28 MED ORDER — ACETAMINOPHEN 650 MG RE SUPP
650.0000 mg | Freq: Four times a day (QID) | RECTAL | Status: DC | PRN
Start: 2017-11-28 — End: 2017-11-30

## 2017-11-28 MED ORDER — ONDANSETRON HCL 4 MG PO TABS: 4.0000 mg | ORAL_TABLET | Freq: Four times a day (QID) | ORAL | Status: DC | PRN

## 2017-11-28 MED ORDER — DONEPEZIL HCL 10 MG PO TABS: 10.0000 mg | ORAL_TABLET | Freq: Every day | ORAL | Status: DC

## 2017-11-28 MED ORDER — SODIUM CHLORIDE 0.9 % IV BOLUS (SEPSIS)
1000.0000 mL | Freq: Once | INTRAVENOUS | Status: AC
  Administered 2017-11-28: 1000 mL via INTRAVENOUS

## 2017-11-28 MED ORDER — PANTOPRAZOLE SODIUM 20 MG PO TBEC: 20.0000 mg | DELAYED_RELEASE_TABLET | Freq: Every day | ORAL | Status: DC

## 2017-11-28 MED ORDER — POTASSIUM CHLORIDE 10 MEQ/50ML IV SOLN: 10.0000 meq | INTRAVENOUS | Status: DC

## 2017-11-28 MED ORDER — SODIUM CHLORIDE 0.9 % IV SOLN
INTRAVENOUS | Status: DC
  Administered 2017-11-28: 17:00:00 via INTRAVENOUS

## 2017-11-28 NOTE — H&P (Signed)
History and Physical    Emily Curtis NWG:[id number] DOB: Apr 04, 1951 DOA: 11/28/2017   PCP: [place], Betty G, MD   Patient coming from:  Home    Chief Complaint:  Syncope with falls  HPI: Emily Curtis is a 67 y.o. female with medical history significant for Alzheimer's dementia, brought to the ED after several syncopal episodes today.  The patient's nephew is at bedside, helping with the history, as the patient is a level 5 caveat and nonverbal.  This report, he found her lying on the ground earlier today, try to help her up and she passed out.  She did not hit her head, or had any other areas of trauma.  He reports that her food and liquid intake has significantly decreased since August, and her overall clinical status has declined.  He states that sometimes she looks at food, and throws it out.  He is not aware if she has any swallowing problems, as when taking her meds, does not regurgitate them.  He denies the patient having any fever, chills, or recent infections.  He denies the patient having any signs of chest pain, or being dyspneic.  No apparent diarrhea, or odor rose urine.  He states that she walks around the house constantly, without becoming tired.  He states that her decline in oral intake correlates with her progression of Alzheimer's, "she has forgotten how to eat ".   ED Course:  BP 105/75   Pulse 66   Temp 98.2 F (36.8 C) (Rectal)   Resp 17   Ht 5\' 4"  (1.626 m)   Wt 87.1 kg (192 lb)   SpO2 100%   BMI 32.96 kg/m   EMS from home, reported that the patient's blood pressure being 72/40.  She initially received 700 cc of normal saline PTA, and another liter of fluid here.  Her blood pressure significantly improved, currently at 105/75. Urine is negative. White count 7.8, hemoglobin is 9.7 today, and as of August 2018, was 12.  This may be more likely to decrease oral intake than GI bleed Platelets normal at 188. Glucose is 137 CT of the head is negative for acute  intracranial abnormality, or injury.  CT of the C-spine is negative as well. Last upper endoscopy, was in July 2007, at which time she was noted to have gastric polyp  EKG shows sinus rhythm, no ACS  review of Systems: Level 5 caveat due to dementia, nonverbal  Past Medical History:  Diagnosis Date  . Alzheimer's dementia   . Frequent headaches   . GERD (gastroesophageal reflux disease)     History reviewed. No pertinent surgical history.  Social History Social History   Socioeconomic History  . Marital status: Widowed    Spouse name: Not on file  . Number of children: Not on file  . Years of education: Not on file  . Highest education level: Not on file  Social Needs  . Financial resource strain: Not on file  . Food insecurity - worry: Not on file  . Food insecurity - inability: Not on file  . Transportation needs - medical: Not on file  . Transportation needs - non-medical: Not on file  Occupational History  . Not on file  Tobacco Use  . Smoking status: Never Smoker  . Smokeless tobacco: Never Used  Substance and Sexual Activity  . Alcohol use: No  . Drug use: No  . Sexual activity: No  Other Topics Concern  . Not on file  Social  History Narrative  . Not on file     Allergies  Allergen Reactions  . Penicillins Other (See Comments)    unknown    Family History  Problem Relation Age of Onset  . Cancer Mother   . Heart disease Mother   . Stroke Mother   . Hypertension Mother   . Heart disease Father   . Stroke Father   . Hypertension Father       Prior to Admission medications   Medication Sig Start Date End Date Taking? Authorizing Provider  cetirizine (ZYRTEC) 10 MG tablet Take 10 mg by mouth daily as needed for allergies.    [provider]  donepezil (ARICEPT) 10 MG tablet TAKE 1 TABLET BY MOUTH AT BEDTIME 11/18/17   [place], Betty G, MD  pantoprazole (PROTONIX) 20 MG tablet Take 1 tablet (20 mg total) by mouth daily. 12/05/16   Arby Barrette, MD  ranitidine (ZANTAC) 150 MG tablet Take 150 mg by mouth 2 (two) times daily as needed for heartburn.    [provider]  traZODone (DESYREL) 50 MG tablet Take 0.5-1 tablets (25-50 mg total) by mouth at bedtime as needed for sleep. 12/18/16   [place], Betty G, MD    Physical Exam:  Vitals:   11/28/17 1416 11/28/17 1430 11/28/17 1445 11/28/17 1515  BP:  107/66 94/73 105/75  Pulse:  97 82 66  Resp:  19 14 17   Temp: 98.2 F (36.8 C)     TempSrc: Rectal     SpO2:  100% 99% 100%  Weight:      Height:       Constitutional: NAD, calm, comfortable, nonverbal, she only made some voice noises but unable to engage in communication Eyes: PERRL, lids and conjunctivae normal ENMT: Mucous membranes are moist, without exudate or lesions  Neck: normal, supple, no masses, no thyromegaly Respiratory: clear to auscultation bilaterally, no wheezing, no crackles. Normal respiratory effort  Cardiovascular: Regular rate and rhythm, very soft 1 out of 6 murmur, rubs or gallops. No extremity edema. 2+ pedal pulses. No carotid bruits.  Abdomen: Soft, non tender, No hepatosplenomegaly. Bowel sounds positive.  Musculoskeletal: no clubbing / cyanosis. Moves all extremities Skin: no jaundice, No lesions.  Neurologic: Sensation intact  Strength equal in all extremities Psychiatric: Awake, but unable to engage in conversation, normal mood.     Labs on Admission: I have personally reviewed following labs and imaging studies  CBC: Recent Labs  Lab 11/28/17 1331  WBC 7.8  NEUTROABS 6.0  HGB 9.7*  HCT 29.8*  MCV 89.2  PLT 188    Basic Metabolic Panel: Recent Labs  Lab 11/28/17 1331  NA 139  K 3.1*  CL 111  CO2 21*  GLUCOSE 137*  BUN 7  CREATININE 0.91  CALCIUM 7.7*    GFR: Estimated Creatinine Clearance: 66.7 mL/min (by C-G formula based on SCr of 0.91 mg/dL).  Liver Function Tests: Recent Labs  Lab 11/28/17 1331  AST 18  ALT 11*  ALKPHOS 77  BILITOT 0.4  PROT 5.7*    ALBUMIN 3.0*   No results for input(s): LIPASE, AMYLASE in the last 168 hours. No results for input(s): AMMONIA in the last 168 hours.  Coagulation Profile: No results for input(s): INR, PROTIME in the last 168 hours.  Cardiac Enzymes: No results for input(s): CKTOTAL, CKMB, CKMBINDEX, TROPONINI in the last 168 hours.  BNP (last 3 results) No results for input(s): PROBNP in the last 8760 hours.  HbA1C: No results for input(s):  HGBA1C in the last 72 hours.  CBG: No results for input(s): GLUCAP in the last 168 hours.  Lipid Profile: No results for input(s): CHOL, HDL, LDLCALC, TRIG, CHOLHDL, LDLDIRECT in the last 72 hours.  Thyroid Function Tests: No results for input(s): TSH, T4TOTAL, FREET4, T3FREE, THYROIDAB in the last 72 hours.  Anemia Panel: No results for input(s): VITAMINB12, FOLATE, FERRITIN, TIBC, IRON, RETICCTPCT in the last 72 hours.  Urine analysis:    Component Value Date/Time   COLORURINE YELLOW 11/28/2017 1409   APPEARANCEUR CLEAR 11/28/2017 1409   LABSPEC 1.014 11/28/2017 1409   PHURINE 6.0 11/28/2017 1409   GLUCOSEU NEGATIVE 11/28/2017 1409   HGBUR NEGATIVE 11/28/2017 1409   BILIRUBINUR NEGATIVE 11/28/2017 1409   KETONESUR NEGATIVE 11/28/2017 1409   PROTEINUR NEGATIVE 11/28/2017 1409   NITRITE NEGATIVE 11/28/2017 1409   LEUKOCYTESUR NEGATIVE 11/28/2017 1409    Sepsis Labs: @LABRCNTIP (procalcitonin:4,lacticidven:4) )No results found for this or any previous visit (from the past 240 hour(s)).   Radiological Exams on Admission: Dg Chest 2 View  Result Date: 11/28/2017 CLINICAL DATA:  Cough and multiple syncopal episodes today. History of hypotension. Dementia. EXAM: CHEST  2 VIEW COMPARISON:  Chest x-ray of July 01, 2017 FINDINGS: The lungs are hypoinflated. The interstitial markings are coarse and slightly more conspicuous overall today. The cardiac silhouette is enlarged. The pulmonary vascularity is prominent. There is no pleural effusion.  IMPRESSION: Bilateral hypoinflation. Mild interstitial prominence, pulmonary vascular congestion, and cardiomegaly suggests low-grade CHF in the appropriate clinical setting. Electronically Signed   By: David  [place] M.D.   On: 11/28/2017 14:58   Ct Head Wo Contrast  Result Date: 11/28/2017 CLINICAL DATA:  67 year old female with multiple syncopal episode today by family report. Patient with Alzheimer's. EXAM: CT HEAD WITHOUT CONTRAST CT CERVICAL SPINE WITHOUT CONTRAST TECHNIQUE: Multidetector CT imaging of the head and cervical spine was performed following the standard protocol without intravenous contrast. Multiplanar CT image reconstructions of the cervical spine were also generated. COMPARISON:  07/01/2017 head CT FINDINGS: CT HEAD FINDINGS Brain: No evidence of acute infarction, hemorrhage, hydrocephalus, extra-axial collection or mass lesion/mass effect. Severe atrophy and moderate chronic small-vessel white matter ischemic changes again noted. Vascular: Atherosclerotic calcifications noted. Skull: No acute abnormality Sinuses/Orbits: Mucosal thickening throughout the paranasal sinuses noted. Fluid within the left maxillary and scattered ethmoid air cells noted. Other: None CT CERVICAL SPINE FINDINGS Alignment: Normal. Skull base and vertebrae: No acute fracture. No primary bone lesion or focal pathologic process. Soft tissues and spinal canal: No prevertebral fluid or swelling. No visible canal hematoma. Disc levels: Mild multilevel degenerative disc disease/ spondylosis and moderate to severe multilevel facet arthropathy identified. Upper chest: No acute abnormality Other: None IMPRESSION: 1. No evidence of acute intracranial abnormality. Severe cerebral atrophy and moderate to severe chronic small-vessel white matter ischemic changes. 2. No static evidence of acute injury to the cervical spine. Degenerative changes as described 3. Paranasal sinus disease likely representing acute on chronic sinusitis.  Electronically Signed   By: Harmon Pier M.D.   On: 11/28/2017 15:00   Ct Cervical Spine Wo Contrast  Result Date: 11/28/2017 CLINICAL DATA:  67 year old female with multiple syncopal episode today by family report. Patient with Alzheimer's. EXAM: CT HEAD WITHOUT CONTRAST CT CERVICAL SPINE WITHOUT CONTRAST TECHNIQUE: Multidetector CT imaging of the head and cervical spine was performed following the standard protocol without intravenous contrast. Multiplanar CT image reconstructions of the cervical spine were also generated. COMPARISON:  07/01/2017 head CT FINDINGS: CT HEAD FINDINGS Brain: No evidence  of acute infarction, hemorrhage, hydrocephalus, extra-axial collection or mass lesion/mass effect. Severe atrophy and moderate chronic small-vessel white matter ischemic changes again noted. Vascular: Atherosclerotic calcifications noted. Skull: No acute abnormality Sinuses/Orbits: Mucosal thickening throughout the paranasal sinuses noted. Fluid within the left maxillary and scattered ethmoid air cells noted. Other: None CT CERVICAL SPINE FINDINGS Alignment: Normal. Skull base and vertebrae: No acute fracture. No primary bone lesion or focal pathologic process. Soft tissues and spinal canal: No prevertebral fluid or swelling. No visible canal hematoma. Disc levels: Mild multilevel degenerative disc disease/ spondylosis and moderate to severe multilevel facet arthropathy identified. Upper chest: No acute abnormality Other: None IMPRESSION: 1. No evidence of acute intracranial abnormality. Severe cerebral atrophy and moderate to severe chronic small-vessel white matter ischemic changes. 2. No static evidence of acute injury to the cervical spine. Degenerative changes as described 3. Paranasal sinus disease likely representing acute on chronic sinusitis. Electronically Signed   By: Harmon PierJeffrey  Hu M.D.   On: 11/28/2017 15:00    EKG: Independently reviewed.  Assessment/Plan Active Problems:   Alzheimer's dementia with  behavioral disturbance   Insomnia disorder   GERD (gastroesophageal reflux disease)   Syncope   Fall   Severe dehydration   Syncopal episodes likely secondary to hypotension likely due to poor:  Septic shock doubted as no source of infection or fever is noted.No pneumothorax on CXR. BP improvement with 1700 cc of IV normal saline.  Initially her blood pressure was 72/40, now increased to 105/75, with pulse at 66. Admit to telemetry observation IVF NS at 100 cc/h  Swallow evaluation   Palliative care evaluation for goals of care, case has been discussed with her nephew who is at bedside, who wishes the patient remains full code until discussion with palliative care, especially regarding feeding issues.  We did not recommend that the patient undergo tube feeding, as her overall clinical status is declining, and these would not improve her quality of life.  Anemia , in a patient with a history of gastric polyps in the past, and current poor oral intake Hb 9, was 12 in 06/2017 . Hcult pending (done by EDP ) Repeat CBC in am  No transfusion or invasive studies  indicated at this time Dietitian consultation   Hypokalemia, may be due to poor diet  EKG SR Current K  3.1  Received  40 meq IVx1 Repeat CMET in am   GERD, no acute symptoms Continue PPI  Alzheimer's dementia Continue Aricept   Insomnia Continue Desyrel  DVT prophylaxis: SCDs, in view of prior history of GI bleed, and a dropping her hemoglobin, we will monitor these counts, and if stable, then the patient can be placed on other agents Code Status:    Discussion with nephew regarding goals of care, concluded that for now, the patient will remain in full code after discussion with palliative care team Family Communication:  Discussed with patient's nephew who is at bedside Disposition Plan: Expect patient to be discharged to home after condition improves Consults called:    Palliative care, for goals of care Admission status:  Telemetry observation   Emily KaysSara Jassica Zazueta, PA-C [facility]   Amion text  [phone]   11/28/2017, 4:22 PM

## 2017-11-28 NOTE — Progress Notes (Addendum)
Received patient from [facility], assessment completed, VS documented, oriented patient to the room.  Will continue to monitor.  Patient is nonverbal and oriented to self, unable to complete admission database at this time.

## 2017-11-28 NOTE — ED Provider Notes (Signed)
[facility] EMERGENCY DEPARTMENT Provider Note   CSN: [id number] Arrival date & time: 11/28/17  1316     History   Chief Complaint Chief Complaint  Patient presents with  . Loss of Consciousness  . Hypotension    HPI Emily Curtis is a 67 y.o. female.  Level V Caveat: Dementia, non-verbal.   Emily Curtis is a 67 y.o. Female resents to the emergency department from home after several syncopal episodes today.  The patient's nephew at bedside helps him with history.  Apparently he found the patient lying on the ground earlier today.  He tried to help her up and she passed out.  He reports this happened before due to dehydration and believes this is what is going on again today.  He reports that she has trouble eating and drinking enough due to her dementia.  He is unsure exactly how much she has been having to eat or drink recently.  EMS reports on their arrival her blood pressure was 72/40.  It improved some with fluids.  They report her pressure was as low as 54 systolic.  He also reports she has been coughing a lot over the past couple days.  Nephew reports that she is nonverbal at baseline and is at her baseline now.  No recent vomiting, fevers or diarrhea.   The history is provided by medical records, the EMS personnel and a relative. No language interpreter was used.  Loss of Consciousness      Past Medical History:  Diagnosis Date  . Alzheimer's dementia   . Frequent headaches   . GERD (gastroesophageal reflux disease)     Patient Active Problem List   Diagnosis Date Noted  . GERD (gastroesophageal reflux disease) 11/28/2017  . Syncope 11/28/2017  . Fall 11/28/2017  . Severe dehydration 11/28/2017  . Alzheimer's dementia with behavioral disturbance 12/18/2016  . 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 No pertinent surgical history.  OB History    No data available       Home Medications    Prior to Admission medications    Medication Sig Start Date End Date Taking? Authorizing Provider  donepezil (ARICEPT) 10 MG tablet TAKE 1 TABLET BY MOUTH AT BEDTIME 11/18/17  Yes [place], Betty G, MD  pantoprazole (PROTONIX) 20 MG tablet Take 1 tablet (20 mg total) by mouth daily. 12/05/16  Yes Arby Barrette, MD  ranitidine (ZANTAC) 150 MG tablet Take 150 mg by mouth 2 (two) times daily as needed for heartburn.   Yes [provider]  traZODone (DESYREL) 50 MG tablet Take 0.5-1 tablets (25-50 mg total) by mouth at bedtime as needed for sleep. 12/18/16  Yes [place], Betty G, MD  cetirizine (ZYRTEC) 10 MG tablet Take 10 mg by mouth daily as needed for allergies.    [provider]    Family History Family History  Problem Relation Age of Onset  . Cancer Mother   . Heart disease Mother   . Stroke Mother   . Hypertension Mother   . Heart disease Father   . Stroke Father   . Hypertension Father     Social History Social History   Tobacco Use  . Smoking status: Never Smoker  . Smokeless tobacco: Never Used  Substance Use Topics  . Alcohol use: No  . Drug use: No     Allergies   Penicillins   Review of Systems Review of Systems  Unable to perform ROS: Dementia  Cardiovascular: Positive for  syncope.     Physical Exam Updated Vital Signs BP 105/75   Pulse 66   Temp 98.2 F (36.8 C) (Rectal)   Resp 17   Ht 5\' 4"  (1.626 m)   Wt 87.1 kg (192 lb)   SpO2 100%   BMI 32.96 kg/m   Physical Exam  Constitutional: She appears well-developed and well-nourished. No distress.  Nontoxic appearing.  HENT:  Head: Normocephalic and atraumatic.  Right Ear: External ear normal.  Left Ear: External ear normal.  Mucous membranes appear dry.  Eyes: Conjunctivae are normal. Pupils are equal, round, and reactive to light. Right eye exhibits no discharge. Left eye exhibits no discharge.  Neck: Neck supple. No JVD present.  Wearing c-collar.  Cardiovascular: Normal rate, regular rhythm, normal heart  sounds and intact distal pulses. Exam reveals no gallop and no friction rub.  No murmur heard. Pulmonary/Chest: Effort normal and breath sounds normal. No respiratory distress. She has no wheezes. She has no rales.  Diminished to bilateral bases.  No increased work of breathing.  Abdominal: Soft. There is no tenderness. There is no guarding.  Musculoskeletal: Normal range of motion. She exhibits no edema, tenderness or deformity.  Patient is spontaneously moving all extremities in a coordinated fashion exhibiting good strength.  No midline neck or back tenderness to palpation.  Lymphadenopathy:    She has no cervical adenopathy.  Neurological: She is alert. Coordination normal.  Alert and pleasant.  Smiling and laughing.  Nonverbal.  Follows all commands. No pronator drift.  Good grip strength bilaterally.  Good strength to her bilateral upper and lower extremities.  Skin: Skin is warm and dry. Capillary refill takes less than 2 seconds. No rash noted. She is not diaphoretic. No erythema. No pallor.  Psychiatric: She has a normal mood and affect. Her behavior is normal.  Nursing note and vitals reviewed.    ED Treatments / Results  Labs (all labs ordered are listed, but only abnormal results are displayed) Labs Reviewed  COMPREHENSIVE METABOLIC PANEL - Abnormal; Notable for the following components:      Result Value   Potassium 3.1 (*)    CO2 21 (*)    Glucose, Bld 137 (*)    Calcium 7.7 (*)    Total Protein 5.7 (*)    Albumin 3.0 (*)    ALT 11 (*)    All other components within normal limits  CBC WITH DIFFERENTIAL/PLATELET - Abnormal; Notable for the following components:   RBC 3.34 (*)    Hemoglobin 9.7 (*)    HCT 29.8 (*)    All other components within normal limits  I-STAT CG4 LACTIC ACID, ED - Abnormal; Notable for the following components:   Lactic Acid, Venous 2.87 (*)    All other components within normal limits  URINALYSIS, ROUTINE W REFLEX MICROSCOPIC  I-STAT  TROPONIN, ED  I-STAT CG4 LACTIC ACID, ED  TYPE AND SCREEN    EKG  EKG Interpretation  Date/Time:  Thursday November 28 2017 13:17:30 EST Ventricular Rate:  61 PR Interval:    QRS Duration: 97 QT Interval:  458 QTC Calculation: 462 R Axis:   11 Text Interpretation:  Sinus rhythm Inferior infarct, old no change from previous Confirmed by Arby Barrette [phone]) on 11/28/2017 3:24:40 PM       Radiology Dg Chest 2 View  Result Date: 11/28/2017 CLINICAL DATA:  Cough and multiple syncopal episodes today. History of hypotension. Dementia. EXAM: CHEST  2 VIEW COMPARISON:  Chest x-ray of July 01, 2017  FINDINGS: The lungs are hypoinflated. The interstitial markings are coarse and slightly more conspicuous overall today. The cardiac silhouette is enlarged. The pulmonary vascularity is prominent. There is no pleural effusion. IMPRESSION: Bilateral hypoinflation. Mild interstitial prominence, pulmonary vascular congestion, and cardiomegaly suggests low-grade CHF in the appropriate clinical setting. Electronically Signed   By: David  [place] M.D.   On: 11/28/2017 14:58   Ct Head Wo Contrast  Result Date: 11/28/2017 CLINICAL DATA:  67 year old female with multiple syncopal episode today by family report. Patient with Alzheimer's. EXAM: CT HEAD WITHOUT CONTRAST CT CERVICAL SPINE WITHOUT CONTRAST TECHNIQUE: Multidetector CT imaging of the head and cervical spine was performed following the standard protocol without intravenous contrast. Multiplanar CT image reconstructions of the cervical spine were also generated. COMPARISON:  07/01/2017 head CT FINDINGS: CT HEAD FINDINGS Brain: No evidence of acute infarction, hemorrhage, hydrocephalus, extra-axial collection or mass lesion/mass effect. Severe atrophy and moderate chronic small-vessel white matter ischemic changes again noted. Vascular: Atherosclerotic calcifications noted. Skull: No acute abnormality Sinuses/Orbits: Mucosal thickening throughout the  paranasal sinuses noted. Fluid within the left maxillary and scattered ethmoid air cells noted. Other: None CT CERVICAL SPINE FINDINGS Alignment: Normal. Skull base and vertebrae: No acute fracture. No primary bone lesion or focal pathologic process. Soft tissues and spinal canal: No prevertebral fluid or swelling. No visible canal hematoma. Disc levels: Mild multilevel degenerative disc disease/ spondylosis and moderate to severe multilevel facet arthropathy identified. Upper chest: No acute abnormality Other: None IMPRESSION: 1. No evidence of acute intracranial abnormality. Severe cerebral atrophy and moderate to severe chronic small-vessel white matter ischemic changes. 2. No static evidence of acute injury to the cervical spine. Degenerative changes as described 3. Paranasal sinus disease likely representing acute on chronic sinusitis. Electronically Signed   By: Harmon Pier M.D.   On: 11/28/2017 15:00   Ct Cervical Spine Wo Contrast  Result Date: 11/28/2017 CLINICAL DATA:  67 year old female with multiple syncopal episode today by family report. Patient with Alzheimer's. EXAM: CT HEAD WITHOUT CONTRAST CT CERVICAL SPINE WITHOUT CONTRAST TECHNIQUE: Multidetector CT imaging of the head and cervical spine was performed following the standard protocol without intravenous contrast. Multiplanar CT image reconstructions of the cervical spine were also generated. COMPARISON:  07/01/2017 head CT FINDINGS: CT HEAD FINDINGS Brain: No evidence of acute infarction, hemorrhage, hydrocephalus, extra-axial collection or mass lesion/mass effect. Severe atrophy and moderate chronic small-vessel white matter ischemic changes again noted. Vascular: Atherosclerotic calcifications noted. Skull: No acute abnormality Sinuses/Orbits: Mucosal thickening throughout the paranasal sinuses noted. Fluid within the left maxillary and scattered ethmoid air cells noted. Other: None CT CERVICAL SPINE FINDINGS Alignment: Normal. Skull base and  vertebrae: No acute fracture. No primary bone lesion or focal pathologic process. Soft tissues and spinal canal: No prevertebral fluid or swelling. No visible canal hematoma. Disc levels: Mild multilevel degenerative disc disease/ spondylosis and moderate to severe multilevel facet arthropathy identified. Upper chest: No acute abnormality Other: None IMPRESSION: 1. No evidence of acute intracranial abnormality. Severe cerebral atrophy and moderate to severe chronic small-vessel white matter ischemic changes. 2. No static evidence of acute injury to the cervical spine. Degenerative changes as described 3. Paranasal sinus disease likely representing acute on chronic sinusitis. Electronically Signed   By: Harmon Pier M.D.   On: 11/28/2017 15:00    Procedures Procedures (including critical care time)  CRITICAL CARE Performed by: Lawana Chambers   Total critical care time: 35 minutes  Critical care time was exclusive of separately billable procedures and treating other  patients.  Critical care was necessary to treat or prevent imminent or life-threatening deterioration.  Critical care was time spent personally by me on the following activities: development of treatment plan with patient and/or surrogate as well as nursing, discussions with consultants, evaluation of patient's response to treatment, examination of patient, obtaining history from patient or surrogate, ordering and performing treatments and interventions, ordering and review of laboratory studies, ordering and review of radiographic studies, pulse oximetry and re-evaluation of patient's condition.  Medications Ordered in ED Medications  donepezil (ARICEPT) tablet 10 mg (not administered)  pantoprazole (PROTONIX) EC tablet 20 mg (not administered)  traZODone (DESYREL) tablet 25-50 mg (not administered)  0.9 %  sodium chloride infusion (not administered)  acetaminophen (TYLENOL) tablet 650 mg (not administered)    Or    acetaminophen (TYLENOL) suppository 650 mg (not administered)  senna-docusate (Senokot-S) tablet 1 tablet (not administered)  bisacodyl (DULCOLAX) suppository 10 mg (not administered)  ondansetron (ZOFRAN) tablet 4 mg (not administered)    Or  ondansetron (ZOFRAN) injection 4 mg (not administered)  sodium chloride 0.9 % bolus 1,000 mL (0 mLs Intravenous Stopped 11/28/17 1450)     Initial Impression / Assessment and Plan / ED Course  I have reviewed the triage vital signs and the nursing notes.  Pertinent labs & imaging results that were available during my care of the patient were reviewed by me and considered in my medical decision making (see chart for details).     This is a 67 y.o. Female resents to the emergency department from home after several syncopal episodes today.  The patient's nephew at bedside helps him with history.  Apparently he found the patient lying on the ground earlier today.  He tried to help her up and she passed out.  He reports this happened before due to dehydration and believes this is what is going on again today.  He reports that she has trouble eating and drinking enough due to her dementia.  He is unsure exactly how much she has been having to eat or drink recently.  EMS reports on their arrival her blood pressure was 72/40.  It improved some with fluids.  They report her pressure was as low as 54 systolic.  He also reports she has been coughing a lot over the past couple days.  Nephew reports that she is nonverbal at baseline and is at her baseline now. On exam the patient is afebrile and nontoxic-appearing.  She is pleasant and smiling during exam.  She is nonverbal.  She follows my commands.  She is slightly hypotensive with a pressure in the 80s over 40s on my initial exam.  Fluids started.  Her mucous membranes do appear dry. Nursing staff reported that there is some bright red blood streaked throughout her stool.  She has brown stool with some bright red blood  streaked through it.  This looks more to be coming from around her rectum or from some skin breakdown.  This is not appear to be GI bleeding.  No melena. Lactic acid returned elevated at 2.87.  I suspect this is related to dehydration versus infection.  She has a normal rectal temperature.  CBC shows no leukocytosis.  Urinalysis without sign of infection.  Troponin is not elevated.  CMP is normal for potassium at 3.1, glucose of 137, normal kidney function, calcium is 7.7, protein of 5.7, and albumin at 3.0. Chest x-ray shows bilateral hyperinflation.  Mild interstitial prominence, pulmonary vascular congestion, and cardiomegaly suggest low-grade CHF  in the appropriate clinical setting. CT head without contrast shows no evidence of acute intracranial abnormality.  CT cervical spine shows no evidence of acute injury to the cervical spine. At reevaluation patient's blood pressure is improving with fluids.  We will plan for admission for continued evaluation of her blood pressure, continued rehydration, recheck of her hemoglobin and recheck of lactic acid level.  Family agrees with plan for admission.  I consulted with [facility] hospitalist Sarah PA who accepted the patient for admission.   This patient was discussed with and evaluated by Dr. Broadus John who agrees with assessment and plan.   Final Clinical Impressions(s) / ED Diagnoses   Final diagnoses:  Syncope and collapse  Dehydration  Anemia, unspecified type  Hypotension due to hypovolemia    ED Discharge Orders    None       Everlene Farrier, PA-C 11/28/17 1612    Arby Barrette, MD 11/29/17 1730

## 2017-11-28 NOTE — ED Provider Notes (Signed)
Medical screening examination/treatment/procedure(s) were conducted as a shared visit with non-physician practitioner(s) and myself.  I personally evaluated the patient during the encounter.   EKG Interpretation  Date/Time:  Thursday November 28 2017 13:17:30 EST Ventricular Rate:  61 PR Interval:    QRS Duration: 97 QT Interval:  458 QTC Calculation: 462 R Axis:   11 Text Interpretation:  Sinus rhythm Inferior infarct, old no change from previous Confirmed by Arby BarrettePfeiffer, Caylon Saine [phone]) on 11/28/2017 3:24:40 PM     Patient has Alzheimer's dementia at baseline.  Her nephew who is with her reports that she is always up walking around.  He reports that recently, she will not eat or drink much.  He reports they bring her food or beverage and she takes just a small amount and then no more.  She wonders if she is possibly dehydrated.  He reports last time something similar this happened she was dehydrated.  Patient has had recurrent hypotension that has been responding to fluid administration.  Patient is cheerful and alert with good eye contact but she is nonverbal.  Heart is regular without gross rub murmur gallop.  Lungs grossly clear.  Abdomen is soft nontender.  Diagnostic results reviewed.  I agree with plan and management.   Arby BarrettePfeiffer, Mayan Kloepfer, MD 11/28/17 1526

## 2017-11-28 NOTE — ED Triage Notes (Signed)
Pt arrives via EMS from home with reports from family that pt has experienced multiple syncopal episodes today. Family reports "more than I can count". Family denies pt hitting head or any know injuries. Towel roll in place on arrival to ed. When EMS arrived on the scene pt BP 72/40. 700cc NS given PTA. PT at baseline per family. Hx of alzheimers. Non verbal at baseline

## 2017-11-28 NOTE — ED Notes (Signed)
Report attempted 

## 2017-11-29 DIAGNOSIS — E876 Hypokalemia: Secondary | ICD-10-CM | POA: Diagnosis present

## 2017-11-29 DIAGNOSIS — Z66 Do not resuscitate: Secondary | ICD-10-CM | POA: Diagnosis present

## 2017-11-29 DIAGNOSIS — I959 Hypotension, unspecified: Secondary | ICD-10-CM | POA: Diagnosis present

## 2017-11-29 DIAGNOSIS — E860 Dehydration: Principal | ICD-10-CM

## 2017-11-29 DIAGNOSIS — E861 Hypovolemia: Secondary | ICD-10-CM | POA: Diagnosis present

## 2017-11-29 DIAGNOSIS — R627 Adult failure to thrive: Secondary | ICD-10-CM | POA: Diagnosis present

## 2017-11-29 DIAGNOSIS — R55 Syncope and collapse: Secondary | ICD-10-CM | POA: Diagnosis present

## 2017-11-29 DIAGNOSIS — I9589 Other hypotension: Secondary | ICD-10-CM

## 2017-11-29 DIAGNOSIS — Z79899 Other long term (current) drug therapy: Secondary | ICD-10-CM | POA: Diagnosis not present

## 2017-11-29 DIAGNOSIS — K219 Gastro-esophageal reflux disease without esophagitis: Secondary | ICD-10-CM | POA: Diagnosis present

## 2017-11-29 DIAGNOSIS — D638 Anemia in other chronic diseases classified elsewhere: Secondary | ICD-10-CM | POA: Diagnosis present

## 2017-11-29 DIAGNOSIS — E872 Acidosis: Secondary | ICD-10-CM | POA: Diagnosis present

## 2017-11-29 DIAGNOSIS — F0281 Dementia in other diseases classified elsewhere with behavioral disturbance: Secondary | ICD-10-CM | POA: Diagnosis present

## 2017-11-29 DIAGNOSIS — G300 Alzheimer's disease with early onset: Secondary | ICD-10-CM

## 2017-11-29 DIAGNOSIS — G4700 Insomnia, unspecified: Secondary | ICD-10-CM

## 2017-11-29 DIAGNOSIS — G309 Alzheimer's disease, unspecified: Secondary | ICD-10-CM | POA: Diagnosis present

## 2017-11-29 DIAGNOSIS — D649 Anemia, unspecified: Secondary | ICD-10-CM

## 2017-11-29 LAB — CBC
HCT: 29.3 % — ABNORMAL LOW (ref 36.0–46.0)
Hemoglobin: 9.5 g/dL — ABNORMAL LOW (ref 12.0–15.0)
MCH: 28.6 pg (ref 26.0–34.0)
MCHC: 32.4 g/dL (ref 30.0–36.0)
MCV: 88.3 fL (ref 78.0–100.0)
PLATELETS: 206 10*3/uL (ref 150–400)
RBC: 3.32 MIL/uL — AB (ref 3.87–5.11)
RDW: 12.8 % (ref 11.5–15.5)
WBC: 6.6 10*3/uL (ref 4.0–10.5)

## 2017-11-29 LAB — COMPREHENSIVE METABOLIC PANEL
ALT: 10 U/L — AB (ref 14–54)
AST: 14 U/L — AB (ref 15–41)
Albumin: 2.9 g/dL — ABNORMAL LOW (ref 3.5–5.0)
Alkaline Phosphatase: 86 U/L (ref 38–126)
Anion gap: 7 (ref 5–15)
CHLORIDE: 111 mmol/L (ref 101–111)
CO2: 19 mmol/L — AB (ref 22–32)
CREATININE: 0.51 mg/dL (ref 0.44–1.00)
Calcium: 8 mg/dL — ABNORMAL LOW (ref 8.9–10.3)
GFR calc Af Amer: >60 mL/min (ref >60)
GFR calc non Af Amer: >60 mL/min (ref >60)
Glucose, Bld: 109 mg/dL — ABNORMAL HIGH (ref 65–99)
POTASSIUM: 3.5 mmol/L (ref 3.5–5.1)
SODIUM: 137 mmol/L (ref 135–145)
Total Bilirubin: 0.7 mg/dL (ref 0.3–1.2)
Total Protein: 5.8 g/dL — ABNORMAL LOW (ref 6.5–8.1)

## 2017-11-29 MED ORDER — DONEPEZIL HCL 10 MG PO TABS
10.0000 mg | ORAL_TABLET | Freq: Every day | ORAL | Status: DC
Start: 2017-11-29 — End: 2017-11-30
  Administered 2017-11-29: 10 mg via ORAL
  Filled 2017-11-29: qty 1

## 2017-11-29 MED ORDER — SODIUM CHLORIDE 0.9 % IV SOLN: INTRAVENOUS | Status: AC

## 2017-11-29 MED ORDER — ENOXAPARIN SODIUM 40 MG/0.4ML ~~LOC~~ SOLN
40.0000 mg | SUBCUTANEOUS | Status: DC
  Administered 2017-11-29 – 2017-11-30 (×2): 40 mg via SUBCUTANEOUS
  Filled 2017-11-29 (×2): qty 0.4

## 2017-11-29 MED ORDER — TRAZODONE HCL 50 MG PO TABS
50.0000 mg | ORAL_TABLET | Freq: Every day | ORAL | Status: DC
Start: 2017-11-29 — End: 2017-11-30
  Administered 2017-11-29: 50 mg via ORAL
  Filled 2017-11-29: qty 1

## 2017-11-29 NOTE — Clinical Social Work Note (Signed)
Received consult for SNF placement. Chart reviewed and PT/OT ordered, however patient has not been evaluated yet. CSW will continue to follow and conduct assessment for SNF, if appropriate, once evaluations completed.  Genelle BalVanessa Jordan Caraveo, MSW, LCSW Licensed Clinical Social Worker Clinical Social Work Department [facility] [phone]

## 2017-11-29 NOTE — Progress Notes (Signed)
Palliative Medicine consult noted. Due to high referral volume, there may be a delay seeing this patient. Please call the Palliative Medicine Team office at [phone] if recommendations are needed in the interim.  Thank you for inviting us to see this patient.  Margret ChanceMelanie G. Deijah Spikes, RN, BSN, [facility] 11/29/2017 2:03 PM Office [phone]

## 2017-11-29 NOTE — Evaluation (Signed)
Clinical/Bedside Swallow Evaluation Patient Details  Name: Emily CaffeyCathy Lynch Magos MRN: [id number] Date of Birth: 10/25/1951  Today's Date: 11/29/2017 Time: SLP Start Time (ACUTE ONLY): 0945 SLP Stop Time (ACUTE ONLY): 0959 SLP Time Calculation (min) (ACUTE ONLY): 14 min  Past Medical History:  Past Medical History:  Diagnosis Date  . Alzheimer's dementia   . Frequent headaches   . GERD (gastroesophageal reflux disease)    Past Surgical History: History reviewed. No pertinent surgical history. HPI:  Emily Curtis is a 67 y.o. female with medical history significant for Alzheimer's dementia, GERD brought to the ED after several syncopal episodes today. Per chart food and liquid intake has significantly decreased since August.    Assessment / Plan / Recommendation Clinical Impression  Pt verbal and pleasant however non meaningful communication; no family present. Unable to participate in oral-motor exam however no abnormalities from observation. Pt very eager to eat/drink and consumed 8 oz water, pack of graham crackers, 4 oz applesauce and part of pudding without s/s aspiration. Swallow initiation appeared timely via observation. Pt likely has times of dementia related swallowing concerns (refusing, holding) as reported per son on admission. Family not present to educate at present. Recommend regular/thin, pills with water if able. Will follow briefly for family education.   SLP Visit Diagnosis: Dysphagia, unspecified (R13.10)    Aspiration Risk  Mild aspiration risk    Diet Recommendation Regular;Thin liquid   Liquid Administration via: Cup;Straw Medication Administration: Whole meds with liquid Supervision: Patient able to self feed;Intermittent supervision to cue for compensatory strategies Compensations: Slow rate;Small sips/bites Postural Changes: Seated upright at 90 degrees    Other  Recommendations Oral Care Recommendations: Oral care BID   Follow up Recommendations None      Frequency and Duration min 1 x/week  2 weeks       Prognosis Barriers to Reach Goals: Cognitive deficits      Swallow Study   General HPI: Emily Curtis is a 67 y.o. female with medical history significant for Alzheimer's dementia, GERD brought to the ED after several syncopal episodes today. Per chart food and liquid intake has significantly decreased since August.  Type of Study: Bedside Swallow Evaluation Previous Swallow Assessment: (none) Diet Prior to this Study: NPO Temperature Spikes Noted: No Respiratory Status: Room air History of Recent Intubation: No Behavior/Cognition: Alert;Cooperative;Pleasant mood;Confused;Requires cueing Oral Cavity Assessment: Within Functional Limits Oral Care Completed by SLP: No Oral Cavity - Dentition: Adequate natural dentition Vision: Functional for self-feeding Self-Feeding Abilities: Able to feed self Patient Positioning: Upright in bed Baseline Vocal Quality: Normal Volitional Cough: Cognitively unable to elicit Volitional Swallow: Unable to elicit    Oral/Motor/Sensory Function Overall Oral Motor/Sensory Function: (unable,no overt weakness or assymetry)   Ice Chips Ice chips: Not tested   Thin Liquid Thin Liquid: Within functional limits Presentation: Cup;Straw    Nectar Thick Nectar Thick Liquid: Not tested   Honey Thick Honey Thick Liquid: Not tested   Puree Puree: Within functional limits   Solid   GO   Solid: Within functional limits        Royce MacadamiaLitaker, Symphani Eckstrom Willis 11/29/2017,10:14 AM  Breck CoonsLisa Willis Lonell FaceLitaker M.Ed [employer] Pager [phone]

## 2017-11-29 NOTE — Progress Notes (Signed)
PROGRESS NOTE    Emily GreenhouseCathy Lynch Curtis  NWG:[id number] DOB: 05/21/1951 DOA: 11/28/2017 PCP: [name], Betty G, MD  Brief Narrative: 67 year old female with advanced dementia, clearly makes tripping/cooing sounds at baseline admitted following a possible syncopal event, brought to the emergency room by her nephew who reports ongoing failure to thrive with decreasing by mouth intake.   Assessment & Plan:     Syncope -Likely secondary to dehydration/hypovolemia, she was hypotensive on admission which improved with fluid administration -Continue IV fluids for 12 more hours today -Telemetry unremarkable -No further workup planned -Will request physical therapy and occupational therapy evaluations    Alzheimer's dementia with behavioral disturbance -She has advanced dementia and mostly nonverbal but only makes chirping/cooing sounds at baseline -According to her nephew she may have forgotten how to eat -she is total care -Will request speech therapy evaluation and palliative care consultation -Continue Aricept and trazodone daily at bedtime  Severe dehydration -Improving, see above    Normocytic anemia -Drop in hemoglobin by over 2 g in the last 6 months -No overt bleeding noted/reported -Monitor, check anemia panel  DVT prophylaxis:add lovenox Code Status: DNR Family Communication: None at bedside Disposition Plan: may need placement  Consultants:   SOP and palliative consult pending   Procedures:   Antimicrobials:    Subjective: -No issues overnight, remains confused  Objective: Vitals:   11/28/17 2133 11/28/17 2300 11/29/17 0300 11/29/17 0845  BP: (!) 86/66 (!) 141/63 131/63 131/60  Pulse: 73 85 76 66  Resp: (!) 21  20 20   Temp: 98.5 F (36.9 C)  98.8 F (37.1 C) 98.2 F (36.8 C)  TempSrc: Oral  Oral Oral  SpO2: 100%  100% 100%  Weight: 87.3 kg (192 lb 7.4 oz)  85.8 kg (189 lb 2.5 oz)   Height:        Intake/Output Summary (Last 24 hours) at 11/29/2017 1114 Last  data filed at 11/29/2017 0556 Gross per 24 hour  Intake 3102.92 ml  Output 375 ml  Net 2727.92 ml   Filed Weights   11/28/17 1326 11/28/17 2133 11/29/17 0300  Weight: 87.1 kg (192 lb) 87.3 kg (192 lb 7.4 oz) 85.8 kg (189 lb 2.5 oz)    Examination:  General exam: alert awake, pleasantly confused and constantly makes cooing noises,  Respiratory systemDecreased breath sounds in the bases, rest clear CVS: S1 & S2 heard, RRR. No JVD, murmurs,  Abdomen is nondistended, soft and nontender.Normal bowel sounds heard. Central nervous system: Pleasantly confused, moves all extremities, no localizing signs Ext: No edema Skin:  chronic venous stasis changes in both legs Psychiatry: Unable to assess, she is pleasantly confused   Data Reviewed:   CBC: Recent Labs  Lab 11/28/17 1331 11/29/17 0614  WBC 7.8 6.6  NEUTROABS 6.0  --   HGB 9.7* 9.5*  HCT 29.8* 29.3*  MCV 89.2 88.3  PLT 188 206   Basic Metabolic Panel: Recent Labs  Lab 11/28/17 1331 11/29/17 0614  NA 139 137  K 3.1* 3.5  CL 111 111  CO2 21* 19*  GLUCOSE 137* 109*  BUN 7 <5*  CREATININE 0.91 0.51  CALCIUM 7.7* 8.0*   GFR: Estimated Creatinine Clearance: 75.3 mL/min (by C-G formula based on SCr of 0.51 mg/dL). Liver Function Tests: Recent Labs  Lab 11/28/17 1331 11/29/17 0614  AST 18 14*  ALT 11* 10*  ALKPHOS 77 86  BILITOT 0.4 0.7  PROT 5.7* 5.8*  ALBUMIN 3.0* 2.9*   No results for input(s): LIPASE, AMYLASE in the  last 168 hours. No results for input(s): AMMONIA in the last 168 hours. Coagulation Profile: No results for input(s): INR, PROTIME in the last 168 hours. Cardiac Enzymes: No results for input(s): CKTOTAL, CKMB, CKMBINDEX, TROPONINI in the last 168 hours. BNP (last 3 results) No results for input(s): PROBNP in the last 8760 hours. HbA1C: No results for input(s): HGBA1C in the last 72 hours. CBG: No results for input(s): GLUCAP in the last 168 hours. Lipid Profile: No results for input(s):  CHOL, HDL, LDLCALC, TRIG, CHOLHDL, LDLDIRECT in the last 72 hours. Thyroid Function Tests: No results for input(s): TSH, T4TOTAL, FREET4, T3FREE, THYROIDAB in the last 72 hours. Anemia Panel: No results for input(s): VITAMINB12, FOLATE, FERRITIN, TIBC, IRON, RETICCTPCT in the last 72 hours. Urine analysis:    Component Value Date/Time   COLORURINE YELLOW 11/28/2017 1409   APPEARANCEUR CLEAR 11/28/2017 1409   LABSPEC 1.014 11/28/2017 1409   PHURINE 6.0 11/28/2017 1409   GLUCOSEU NEGATIVE 11/28/2017 1409   HGBUR NEGATIVE 11/28/2017 1409   BILIRUBINUR NEGATIVE 11/28/2017 1409   KETONESUR NEGATIVE 11/28/2017 1409   PROTEINUR NEGATIVE 11/28/2017 1409   NITRITE NEGATIVE 11/28/2017 1409   LEUKOCYTESUR NEGATIVE 11/28/2017 1409   Sepsis Labs: @LABRCNTIP (procalcitonin:4,lacticidven:4)  )No results found for this or any previous visit (from the past 240 hour(s)).       Radiology Studies: Dg Chest 2 View  Result Date: 11/28/2017 CLINICAL DATA:  Cough and multiple syncopal episodes today. History of hypotension. Dementia. EXAM: CHEST  2 VIEW COMPARISON:  Chest x-ray of July 01, 2017 FINDINGS: The lungs are hypoinflated. The interstitial markings are coarse and slightly more conspicuous overall today. The cardiac silhouette is enlarged. The pulmonary vascularity is prominent. There is no pleural effusion. IMPRESSION: Bilateral hypoinflation. Mild interstitial prominence, pulmonary vascular congestion, and cardiomegaly suggests low-grade CHF in the appropriate clinical setting. Electronically Signed   By: David  [place] M.D.   On: 11/28/2017 14:58   Ct Head Wo Contrast  Result Date: 11/28/2017 CLINICAL DATA:  67 year old female with multiple syncopal episode today by family report. Patient with Alzheimer's. EXAM: CT HEAD WITHOUT CONTRAST CT CERVICAL SPINE WITHOUT CONTRAST TECHNIQUE: Multidetector CT imaging of the head and cervical spine was performed following the standard protocol without  intravenous contrast. Multiplanar CT image reconstructions of the cervical spine were also generated. COMPARISON:  07/01/2017 head CT FINDINGS: CT HEAD FINDINGS Brain: No evidence of acute infarction, hemorrhage, hydrocephalus, extra-axial collection or mass lesion/mass effect. Severe atrophy and moderate chronic small-vessel white matter ischemic changes again noted. Vascular: Atherosclerotic calcifications noted. Skull: No acute abnormality Sinuses/Orbits: Mucosal thickening throughout the paranasal sinuses noted. Fluid within the left maxillary and scattered ethmoid air cells noted. Other: None CT CERVICAL SPINE FINDINGS Alignment: Normal. Skull base and vertebrae: No acute fracture. No primary bone lesion or focal pathologic process. Soft tissues and spinal canal: No prevertebral fluid or swelling. No visible canal hematoma. Disc levels: Mild multilevel degenerative disc disease/ spondylosis and moderate to severe multilevel facet arthropathy identified. Upper chest: No acute abnormality Other: None IMPRESSION: 1. No evidence of acute intracranial abnormality. Severe cerebral atrophy and moderate to severe chronic small-vessel white matter ischemic changes. 2. No static evidence of acute injury to the cervical spine. Degenerative changes as described 3. Paranasal sinus disease likely representing acute on chronic sinusitis. Electronically Signed   By: Harmon Pier M.D.   On: 11/28/2017 15:00   Ct Cervical Spine Wo Contrast  Result Date: 11/28/2017 CLINICAL DATA:  67 year old female with multiple syncopal episode today by  family report. Patient with Alzheimer's. EXAM: CT HEAD WITHOUT CONTRAST CT CERVICAL SPINE WITHOUT CONTRAST TECHNIQUE: Multidetector CT imaging of the head and cervical spine was performed following the standard protocol without intravenous contrast. Multiplanar CT image reconstructions of the cervical spine were also generated. COMPARISON:  07/01/2017 head CT FINDINGS: CT HEAD FINDINGS Brain:  No evidence of acute infarction, hemorrhage, hydrocephalus, extra-axial collection or mass lesion/mass effect. Severe atrophy and moderate chronic small-vessel white matter ischemic changes again noted. Vascular: Atherosclerotic calcifications noted. Skull: No acute abnormality Sinuses/Orbits: Mucosal thickening throughout the paranasal sinuses noted. Fluid within the left maxillary and scattered ethmoid air cells noted. Other: None CT CERVICAL SPINE FINDINGS Alignment: Normal. Skull base and vertebrae: No acute fracture. No primary bone lesion or focal pathologic process. Soft tissues and spinal canal: No prevertebral fluid or swelling. No visible canal hematoma. Disc levels: Mild multilevel degenerative disc disease/ spondylosis and moderate to severe multilevel facet arthropathy identified. Upper chest: No acute abnormality Other: None IMPRESSION: 1. No evidence of acute intracranial abnormality. Severe cerebral atrophy and moderate to severe chronic small-vessel white matter ischemic changes. 2. No static evidence of acute injury to the cervical spine. Degenerative changes as described 3. Paranasal sinus disease likely representing acute on chronic sinusitis. Electronically Signed   By: Harmon Pier M.D.   On: 11/28/2017 15:00        Scheduled Meds: Continuous Infusions: . sodium chloride 75 mL/hr at 11/29/17 0919     LOS: 0 days    Time spent: as     Zannie Cove, MD [facility] Page via [email or web address], password [email or web address] After 7PM please contact night-coverage  11/29/2017, 11:14 AM

## 2017-11-30 LAB — CBC
HCT: 28.9 % — ABNORMAL LOW (ref 36.0–46.0)
HEMOGLOBIN: 9.6 g/dL — AB (ref 12.0–15.0)
MCH: 29.4 pg (ref 26.0–34.0)
MCHC: 33.2 g/dL (ref 30.0–36.0)
MCV: 88.4 fL (ref 78.0–100.0)
Platelets: 229 10*3/uL (ref 150–400)
RBC: 3.27 MIL/uL — AB (ref 3.87–5.11)
RDW: 12.8 % (ref 11.5–15.5)
WBC: 7.4 10*3/uL (ref 4.0–10.5)

## 2017-11-30 LAB — BASIC METABOLIC PANEL
ANION GAP: 8 (ref 5–15)
BUN: 5 mg/dL — AB (ref 6–20)
CHLORIDE: 105 mmol/L (ref 101–111)
CO2: 24 mmol/L (ref 22–32)
Calcium: 8.5 mg/dL — ABNORMAL LOW (ref 8.9–10.3)
Creatinine, Ser: 0.61 mg/dL (ref 0.44–1.00)
GFR calc Af Amer: >60 mL/min (ref >60)
GFR calc non Af Amer: >60 mL/min (ref >60)
GLUCOSE: 112 mg/dL — AB (ref 65–99)
POTASSIUM: 3.4 mmol/L — AB (ref 3.5–5.1)
SODIUM: 137 mmol/L (ref 135–145)

## 2017-11-30 NOTE — Progress Notes (Signed)
Emily Curtis to be D/C'd Home per MD order.  Discussed prescriptions and follow up appointments with the patient. Prescriptions given to patient, medication list explained in detail. Pt verbalized understanding.  Allergies as of 11/30/2017      Reactions   Penicillins Hives, Other (See Comments)   Welts, also Has patient had a PCN reaction causing immediate rash, facial/tongue/throat swelling, SOB or lightheadedness with hypotension: Yes Has patient had a PCN reaction causing severe rash involving mucus membranes or skin necrosis: Unk Has patient had a PCN reaction that required hospitalization: No Has patient had a PCN reaction occurring within the last 10 years: No If all of the above answers are "NO", then may proceed with Cephalosporin use.      Medication List    TAKE these medications   acetaminophen 325 MG tablet Commonly known as:  TYLENOL Take 325-650 mg by mouth every 6 (six) hours as needed (for headaches).   cetirizine 10 MG tablet Commonly known as:  ZYRTEC Take 10 mg by mouth daily.   donepezil 10 MG tablet Commonly known as:  ARICEPT TAKE 1 TABLET BY MOUTH AT BEDTIME What changed:    how much to take  how to take this  when to take this   pantoprazole 20 MG tablet Commonly known as:  PROTONIX Take 1 tablet (20 mg total) by mouth daily.   ranitidine 150 MG tablet Commonly known as:  ZANTAC Take 150 mg by mouth 2 (two) times daily as needed for heartburn.       Vitals:   11/30/17 0855 11/30/17 1644  BP: (!) 120/55 121/66  Pulse: 61 79  Resp: 18 18  Temp: 98.2 F (36.8 C) 98.4 F (36.9 C)  SpO2: 100% 100%    Skin clean, dry and intact without evidence of skin break down, no evidence of skin tears noted. IV catheter discontinued intact. Site without signs and symptoms of complications. Dressing and pressure applied. Pt denies pain at this time. No complaints noted.  An After Visit Summary was printed and given to the patient. Patient escorted  via WC, and D/C home via private auto.  Emily CobbsMolly Weismiller RN [facility] Phone [phone]

## 2017-11-30 NOTE — Evaluation (Signed)
Occupational Therapy Evaluation Patient Details Name: Emily Curtis MRN: [id number] DOB: May 05, 1951 Today's Date: 11/30/2017    History of Present Illness Pt is a 67 y/o female admitted secondary to syncopal episodes and falls. PMH including but not limited to alzheimer's dementia.   Clinical Impression   Pt admitted with the above diagnoses and presents with below problem list. Pt will benefit from continued acute OT to address the below listed deficits and maximize independence with basic ADLs prior to d/c to venue below. Unclear what PLOF with ADLs is as no family present during session. Pt observed to feed self at end of session but likely needs some assist to complete full meal. Pt ambulated in room with min A to steady balance. Pleasantly confused.       Follow Up Recommendations  SNF;Supervision/Assistance - 24 hour    Equipment Recommendations  Other (comment)(defer to next venue)    Recommendations for Other Services       Precautions / Restrictions Precautions Precautions: Fall Restrictions Weight Bearing Restrictions: No      Mobility Bed Mobility Overal bed mobility: Needs Assistance Bed Mobility: Supine to Sit     Supine to sit: HOB elevated;Mod assist     General bed mobility comments: assist to powerup to EOB.   Transfers Overall transfer level: Needs assistance Equipment used: None Transfers: Sit to/from Stand Sit to Stand: Min guard         General transfer comment: min guard for safety, cueing/gesturing for pt understanding of task    Balance Overall balance assessment: Needs assistance Sitting-balance support: Feet supported Sitting balance-Leahy Scale: Good     Standing balance support: During functional activity;No upper extremity supported Standing balance-Leahy Scale: Fair                             ADL either performed or assessed with clinical judgement   ADL Overall ADL's : Needs  assistance/impaired Eating/Feeding: Set up;Minimal assistance;Sitting Eating/Feeding Details (indicate cue type and reason): pt observed to feed self at end of session. May need assist for finishing a meal.  Grooming: Maximal assistance;Sitting Grooming Details (indicate cue type and reason): Pt placed comb to head when visually cued but did not complete full combing task.  Upper Body Bathing: Maximal assistance;Sitting   Lower Body Bathing: Maximal assistance;Sit to/from stand   Upper Body Dressing : Maximal assistance;Sitting   Lower Body Dressing: Maximal assistance;Sit to/from stand   Toilet Transfer: Minimal assistance Toilet Transfer Details (indicate cue type and reason): assist to steady Toileting- Clothing Manipulation and Hygiene: Moderate assistance;Sit to/from stand       Functional mobility during ADLs: Minimal assistance General ADL Comments: Pt followed visual cues and gestures to complete ambulating EOB>recliner>BSC in room. Pleasantly confused. Min A to steady with mobility. Did not use rw during session as baseline with mobility is unknown.      Vision         Perception     Praxis      Pertinent Vitals/Pain Pain Assessment: Faces Faces Pain Scale: No hurt     Hand Dominance     Extremity/Trunk Assessment Upper Extremity Assessment Upper Extremity Assessment: Generalized weakness   Lower Extremity Assessment Lower Extremity Assessment: Defer to PT evaluation       Communication Communication Communication: Expressive difficulties;Receptive difficulties   Cognition Arousal/Alertness: Awake/alert Behavior During Therapy: WFL for tasks assessed/performed Overall Cognitive Status: History of cognitive impairments - at baseline  General Comments: overall pt responded well to gesturing and tactile cueing as well as some simple one-step commands.   General Comments       Exercises     Shoulder  Instructions      Home Living Family/patient expects to be discharged to:: Unsure                                 Additional Comments: pt with advanced dementia, unable to provide any (reliable) information and no family/caregivers present to provide any information      Prior Functioning/Environment          Comments: pt with advanced dementia, unable to provide any (reliable) information and no family/caregivers present to provide any information        OT Problem List: Decreased activity tolerance;Impaired balance (sitting and/or standing);Decreased cognition;Decreased knowledge of use of DME or AE;Decreased knowledge of precautions      OT Treatment/Interventions: Self-care/ADL training;Energy conservation;DME and/or AE instruction;Therapeutic activities;Patient/family education;Balance training    OT Goals(Current goals can be found in the care plan section) Acute Rehab OT Goals Patient Stated Goal: unable to state Time For Goal Achievement: 12/14/17 Potential to Achieve Goals: Good ADL Goals Pt Will Perform Eating: with supervision;sitting Pt Will Transfer to Toilet: with supervision;ambulating Additional ADL Goal #1: Pt will complete bed mobility at supervision level to prepare for OOB ADLs.  OT Frequency: Min 2X/week   Barriers to D/C:            Co-evaluation              AM-PAC PT "6 Clicks" Daily Activity     Outcome Measure Help from another person eating meals?: A Little Help from another person taking care of personal grooming?: A Lot Help from another person toileting, which includes using toliet, bedpan, or urinal?: A Lot Help from another person bathing (including washing, rinsing, drying)?: A Lot Help from another person to put on and taking off regular upper body clothing?: A Lot Help from another person to put on and taking off regular lower body clothing?: A Lot 6 Click Score: 13   End of Session Equipment Utilized During  Treatment: Gait belt Nurse Communication: Mobility status  Activity Tolerance: Patient tolerated treatment well Patient left: in chair;with call bell/phone within reach;with chair alarm set  OT Visit Diagnosis: Unsteadiness on feet (R26.81);Muscle weakness (generalized) (M62.81);Other symptoms and signs involving cognitive function                Time: [id number] OT Time Calculation (min): 22 min Charges:  OT General Charges $OT Visit: 1 Visit OT Evaluation $OT Eval Low Complexity: 1 Low G-Codes:       Pilar GrammesMathews, Gillian Meeuwsen H 11/30/2017, 4:03 PM

## 2017-11-30 NOTE — Care Management Note (Signed)
Case Management Note  Patient Details  Name: Emily Curtis MRN: [id number] Date of Birth: 08/09/1951  Subjective/Objective:           Pt presented from home with family for syncope.  Pt with advanced dementia.  MD requests HH PT and SW to assist with Medicaid application and SNF placement.      Action/Plan: Jermaine with [facility] to evaluate for charity care.   Expected Discharge Date:  11/30/17               Expected Discharge Plan:  Home w Home Health Services  In-House Referral:  Clinical Social Work, Museum/gallery exhibitions officerinancial Counselor  Discharge planning Services  CM Consult  Post Acute Care Choice:  Home Health Choice offered to:  Patient([facility] only choice because charity care)  DME Arranged:  N/A DME Agency:  NA  HH Arranged:  NA HH Agency:  [facility]  Status of Service:  Completed, signed off  If discussed at Long Length of Stay Meetings, dates discussed:    Additional Comments:  [name], [name], RN 11/30/2017, 4:58 PM

## 2017-11-30 NOTE — Clinical Social Work Note (Signed)
CSW spoke with pt's daughter-per request of MD. Pt's daughter states the family can care for the pt at home. Pt's daughter states she doesn't believe pt needs therapies even at home. CSW explained RNCM will set up home health--pt's sister agreeable. Pt's sister states financial counselor has started medicaid application at this time. CSW attempted to reach out to counselor assigned to pt--left voicemail, awaiting call back. MD notified.  P[place]mouthBridget Brieanne Mignone, [place] [phone]

## 2017-11-30 NOTE — Discharge Summary (Signed)
Physician Discharge Summary  Emily GreenhouseCathy Lynch Curtis ZOX:[id number] DOB: 07/07/1951 DOA: 11/28/2017  PCP: [name], Betty G, MD  Admit date: 11/28/2017 Discharge date: 11/30/2017  Admitted From: Home  Disposition:  Home   Recommendations for Outpatient Follow-up:  1. Follow up with PCP within 1 month if able   Home Health: Yes  Equipment/Devices: None  Discharge Condition: Fair  CODE STATUS: DO NOT RESUSCITATE Diet recommendation: Regular, thin liquids  Brief/Interim Summary: Emily Curtis is a 67 yo F with advanced dementia who presents with fall, being found down.  Evidently, the patient lives with family.  There was no preceding illness, but the day of admission, her nephew found her on the ground, unable to stand.  EMS were called and found her BP 70s/40s on arrival.  In the ED, she had no WBC elevation or fever, no signs of pneumonia or UTI.  She was given fluids and had improvement in blood pressure.  She was admitted for further management.  She required >24 hours fluids, and electrolyte replacement.  She was evaluated by PT, who recommended SNF.   The patient's family reported that she is able to ambulate without assistive device at baseline, but requires assistance with eating, toileting, dressing and bathing.  She is nonverbal and has been for >1 year.  ([place] dementia [place] in [place]; work up and evaluation with MRI and specialty care in [place] resulted in diagnosis of Alzheimer's; since the death of her husband in 2017, she has been living with her sister and nephew in [place]).   In the hospital, she required assistance 1 person for transfers, was abe to ambulate 50 feet with gaurding.  She could feed herself but was judged by OT to require assistance for completion of the meal.  She required total assist for other ADLs.  On the day of discharge, her electrolytes were replaced, BP was normal, and [facility] hope to place the  patient in memory care in the future.    Discharge Diagnoses:  Active Problems:   Alzheimer's dementia with behavioral disturbance   Insomnia disorder   GERD (gastroesophageal reflux disease)   Syncope   Fall   Severe dehydration    Discharge Instructions  Discharge Instructions    Diet general   Complete by:  As directed    Discharge instructions   Complete by:  As directed    From Dr. Maryfrances Bunnellanford: Your sister was admitted to the hospital for dehydration leading to her collapse.  Sometimes it is hard to know why this happens, but it completely resolved with fluids in the hospital.  Offer fluids frequently. Ensure that she attempts to drink 8 glasses of water each day. She may have a regular diet, but it is not unlikely that she will withold food at times, or refuse to eat at times, just continue to offer.  Continue the medicines started by Dr. [name], and follow up with Dr. [name] when able.  Home health will be dispatched to the house to help with physical therapy, and to provide some social work assistance with placement in a nursing home here in [place] when able.  Your sister should have 24-hr supervision.   Increase activity slowly   Complete by:  As directed      Allergies as of 11/30/2017      Reactions   Penicillins Hives, Other (See Comments)   Welts, also Has patient had a PCN reaction causing immediate rash, facial/tongue/throat swelling, SOB or  lightheadedness with hypotension: Yes Has patient had a PCN reaction causing severe rash involving mucus membranes or skin necrosis: Unk Has patient had a PCN reaction that required hospitalization: No Has patient had a PCN reaction occurring within the last 10 years: No If all of the above answers are "NO", then may proceed with Cephalosporin use.      Medication List    TAKE these medications   acetaminophen 325 MG tablet Commonly known as:  TYLENOL Take 325-650 mg by mouth every 6 (six) hours as needed (for  headaches).   cetirizine 10 MG tablet Commonly known as:  ZYRTEC Take 10 mg by mouth daily.   donepezil 10 MG tablet Commonly known as:  ARICEPT TAKE 1 TABLET BY MOUTH AT BEDTIME What changed:    how much to take  how to take this  when to take this   pantoprazole 20 MG tablet Commonly known as:  PROTONIX Take 1 tablet (20 mg total) by mouth daily.   ranitidine 150 MG tablet Commonly known as:  ZANTAC Take 150 mg by mouth 2 (two) times daily as needed for heartburn.      Follow-up Information    [facility]   Specialty:  Home Health Services Why:  Physical therapist and social worker will contact you in next 2 days.  Contact information: [address] [place] [address] [phone]          Allergies  Allergen Reactions  . Penicillins Hives and Other (See Comments)    Welts, also Has patient had a PCN reaction causing immediate rash, facial/tongue/throat swelling, SOB or lightheadedness with hypotension: Yes Has patient had a PCN reaction causing severe rash involving mucus membranes or skin necrosis: Unk Has patient had a PCN reaction that required hospitalization: No Has patient had a PCN reaction occurring within the last 10 years: No If all of the above answers are "NO", then may proceed with Cephalosporin use.     Consultations:  SW, CM   Procedures/Studies: Dg Chest 2 View  Result Date: 11/28/2017 CLINICAL DATA:  Cough and multiple syncopal episodes today. History of hypotension. Dementia. EXAM: CHEST  2 VIEW COMPARISON:  Chest x-ray of July 01, 2017 FINDINGS: The lungs are hypoinflated. The interstitial markings are coarse and slightly more conspicuous overall today. The cardiac silhouette is enlarged. The pulmonary vascularity is prominent. There is no pleural effusion. IMPRESSION: Bilateral hypoinflation. Mild interstitial prominence, pulmonary vascular congestion, and cardiomegaly suggests low-grade CHF in  the appropriate clinical setting. Electronically Signed   By: David  Swaziland M.D.   On: 11/28/2017 14:58   Ct Head Wo Contrast  Result Date: 11/28/2017 CLINICAL DATA:  67 year old female with multiple syncopal episode today by family report. Patient with Alzheimer's. EXAM: CT HEAD WITHOUT CONTRAST CT CERVICAL SPINE WITHOUT CONTRAST TECHNIQUE: Multidetector CT imaging of the head and cervical spine was performed following the standard protocol without intravenous contrast. Multiplanar CT image reconstructions of the cervical spine were also generated. COMPARISON:  07/01/2017 head CT FINDINGS: CT HEAD FINDINGS Brain: No evidence of acute infarction, hemorrhage, hydrocephalus, extra-axial collection or mass lesion/mass effect. Severe atrophy and moderate chronic small-vessel white matter ischemic changes again noted. Vascular: Atherosclerotic calcifications noted. Skull: No acute abnormality Sinuses/Orbits: Mucosal thickening throughout the paranasal sinuses noted. Fluid within the left maxillary and scattered ethmoid air cells noted. Other: None CT CERVICAL SPINE FINDINGS Alignment: Normal. Skull base and vertebrae: No acute fracture. No primary bone lesion or focal pathologic process. Soft tissues  and spinal canal: No prevertebral fluid or swelling. No visible canal hematoma. Disc levels: Mild multilevel degenerative disc disease/ spondylosis and moderate to severe multilevel facet arthropathy identified. Upper chest: No acute abnormality Other: None IMPRESSION: 1. No evidence of acute intracranial abnormality. Severe cerebral atrophy and moderate to severe chronic small-vessel white matter ischemic changes. 2. No static evidence of acute injury to the cervical spine. Degenerative changes as described 3. Paranasal sinus disease likely representing acute on chronic sinusitis. Electronically Signed   By: Harmon Pier M.D.   On: 11/28/2017 15:00   Ct Cervical Spine Wo Contrast  Result Date: 11/28/2017 CLINICAL DATA:   67 year old female with multiple syncopal episode today by family report. Patient with Alzheimer's. EXAM: CT HEAD WITHOUT CONTRAST CT CERVICAL SPINE WITHOUT CONTRAST TECHNIQUE: Multidetector CT imaging of the head and cervical spine was performed following the standard protocol without intravenous contrast. Multiplanar CT image reconstructions of the cervical spine were also generated. COMPARISON:  07/01/2017 head CT FINDINGS: CT HEAD FINDINGS Brain: No evidence of acute infarction, hemorrhage, hydrocephalus, extra-axial collection or mass lesion/mass effect. Severe atrophy and moderate chronic small-vessel white matter ischemic changes again noted. Vascular: Atherosclerotic calcifications noted. Skull: No acute abnormality Sinuses/Orbits: Mucosal thickening throughout the paranasal sinuses noted. Fluid within the left maxillary and scattered ethmoid air cells noted. Other: None CT CERVICAL SPINE FINDINGS Alignment: Normal. Skull base and vertebrae: No acute fracture. No primary bone lesion or focal pathologic process. Soft tissues and spinal canal: No prevertebral fluid or swelling. No visible canal hematoma. Disc levels: Mild multilevel degenerative disc disease/ spondylosis and moderate to severe multilevel facet arthropathy identified. Upper chest: No acute abnormality Other: None IMPRESSION: 1. No evidence of acute intracranial abnormality. Severe cerebral atrophy and moderate to severe chronic small-vessel white matter ischemic changes. 2. No static evidence of acute injury to the cervical spine. Degenerative changes as described 3. Paranasal sinus disease likely representing acute on chronic sinusitis. Electronically Signed   By: Harmon Pier M.D.   On: 11/28/2017 15:00       Subjective: Patient coos pleasantly, is nonverbal otherwise, does not follow commands reliably.  Discharge Exam: Vitals:   11/30/17 0855 11/30/17 1644  BP: (!) 120/55 121/66  Pulse: 61 79  Resp: 18 18  Temp: 98.2 F (36.8  C) 98.4 F (36.9 C)  SpO2: 100% 100%   Vitals:   11/29/17 2049 11/30/17 0558 11/30/17 0855 11/30/17 1644  BP: 128/67 127/64 (!) 120/55 121/66  Pulse: 76 77 61 79  Resp: 20 19 18 18   Temp: 98.2 F (36.8 C) 98.5 F (36.9 C) 98.2 F (36.8 C) 98.4 F (36.9 C)  TempSrc: Oral Oral Oral Oral  SpO2: 100% 100% 100% 100%  Weight: 85.6 kg (188 lb 11.4 oz)     Height:        General: Pt is awake, sitting in chair, not in acute distress; coos and smiles Cardiovascular: RRR, S1/S2 +, no rubs, no gallops Respiratory: CTA bilaterally, no wheezing, no rhonchi Abdominal: Soft, NT, ND, bowel sounds + Extremities: no edema, no cyanosis    The results of significant diagnostics from this hospitalization (including imaging, microbiology, ancillary and laboratory) are listed below for reference.     Microbiology: No results found for this or any previous visit (from the past 240 hour(s)).   Labs: BNP (last 3 results) No results for input(s): BNP in the last 8760 hours. Basic Metabolic Panel: Recent Labs  Lab 11/28/17 1331 11/29/17 0614 11/30/17 0506  NA 139 137 137  K 3.1* 3.5 3.4*  CL 111 111 105  CO2 21* 19* 24  GLUCOSE 137* 109* 112*  BUN 7 <5* 5*  CREATININE 0.91 0.51 0.61  CALCIUM 7.7* 8.0* 8.5*   Liver Function Tests: Recent Labs  Lab 11/28/17 1331 11/29/17 0614  AST 18 14*  ALT 11* 10*  ALKPHOS 77 86  BILITOT 0.4 0.7  PROT 5.7* 5.8*  ALBUMIN 3.0* 2.9*   No results for input(s): LIPASE, AMYLASE in the last 168 hours. No results for input(s): AMMONIA in the last 168 hours. CBC: Recent Labs  Lab 11/28/17 1331 11/29/17 0614 11/30/17 0506  WBC 7.8 6.6 7.4  NEUTROABS 6.0  --   --   HGB 9.7* 9.5* 9.6*  HCT 29.8* 29.3* 28.9*  MCV 89.2 88.3 88.4  PLT 188 206 229   Cardiac Enzymes: No results for input(s): CKTOTAL, CKMB, CKMBINDEX, TROPONINI in the last 168 hours. BNP: Invalid input(s): POCBNP CBG: No results for input(s): GLUCAP in the last 168  hours. D-Dimer No results for input(s): DDIMER in the last 72 hours. Hgb A1c No results for input(s): HGBA1C in the last 72 hours. Lipid Profile No results for input(s): CHOL, HDL, LDLCALC, TRIG, CHOLHDL, LDLDIRECT in the last 72 hours. Thyroid function studies No results for input(s): TSH, T4TOTAL, T3FREE, THYROIDAB in the last 72 hours.  Invalid input(s): FREET3 Anemia work up No results for input(s): VITAMINB12, FOLATE, FERRITIN, TIBC, IRON, RETICCTPCT in the last 72 hours. Urinalysis    Component Value Date/Time   COLORURINE YELLOW 11/28/2017 1409   APPEARANCEUR CLEAR 11/28/2017 1409   LABSPEC 1.014 11/28/2017 1409   PHURINE 6.0 11/28/2017 1409   GLUCOSEU NEGATIVE 11/28/2017 1409   HGBUR NEGATIVE 11/28/2017 1409   BILIRUBINUR NEGATIVE 11/28/2017 1409   KETONESUR NEGATIVE 11/28/2017 1409   PROTEINUR NEGATIVE 11/28/2017 1409   NITRITE NEGATIVE 11/28/2017 1409   LEUKOCYTESUR NEGATIVE 11/28/2017 1409   Sepsis Labs Invalid input(s): PROCALCITONIN,  WBC,  LACTICIDVEN Microbiology No results found for this or any previous visit (from the past 240 hour(s)).   Time coordinating discharge: More than 30 minutes  SIGNED:   Alberteen Sam, MD  [facility] 11/30/2017, 8:50 PM

## 2017-11-30 NOTE — Evaluation (Signed)
Physical Therapy Evaluation Patient Details Name: Emily Curtis MRN: [id number] DOB: July 03, 1951 Today's Date: 11/30/2017   History of Present Illness  Pt is a 67 y/o female admitted secondary to syncopal episodes and falls. PMH including but not limited to alzheimer's dementia.    Clinical Impression  Pt presented sitting OOB in recliner chair, awake and willing to participate in therapy session. Pt with cognitive deficits at baseline and was unable to provide any information regarding home environment or PLOF. No family or caregivers present throughout session. Pt ambulated in her room with use of RW and min guard for safety with no LOB or need for physical assistance. Pt would continue to benefit from skilled physical therapy services at this time while admitted and after d/c to address the below listed limitations in order to improve overall safety and independence with functional mobility.      Follow Up Recommendations SNF;Supervision/Assistance - 24 hour;Other (comment)(for safety; unless family can provide 24/7 assistance)    Equipment Recommendations  None recommended by PT    Recommendations for Other Services       Precautions / Restrictions Precautions Precautions: Fall Restrictions Weight Bearing Restrictions: No      Mobility  Bed Mobility               General bed mobility comments: pt OOB in recliner chair upon arrival  Transfers Overall transfer level: Needs assistance Equipment used: None Transfers: Sit to/from Stand Sit to Stand: Min guard         General transfer comment: min guard for safety, cueing/gesturing for pt understanding of task  Ambulation/Gait Ambulation/Gait assistance: Min guard Ambulation Distance (Feet): 25 Feet Assistive device: Rolling walker (2 wheeled) Gait Pattern/deviations: Step-through pattern;Decreased stride length Gait velocity: decreased Gait velocity interpretation: Below normal speed for age/gender General  Gait Details: pt steady with use of RW; intially did not have RW in front of pt upon standing; however, pt ambulated ~2' and grasped RW herself  Stairs            Wheelchair Mobility    Modified Rankin (Stroke Patients Only)       Balance Overall balance assessment: Needs assistance Sitting-balance support: Feet supported Sitting balance-Leahy Scale: Good     Standing balance support: During functional activity;No upper extremity supported Standing balance-Leahy Scale: Fair                               Pertinent Vitals/Pain Pain Assessment: Faces Faces Pain Scale: No hurt    Home Living Family/patient expects to be discharged to:: Unsure                 Additional Comments: pt with advanced dementia, unable to provide any (reliable) information and no family/caregivers present to provide any information    Prior Function           Comments: pt with advanced dementia, unable to provide any (reliable) information and no family/caregivers present to provide any information     Hand Dominance        Extremity/Trunk Assessment   Upper Extremity Assessment Upper Extremity Assessment: Defer to OT evaluation    Lower Extremity Assessment Lower Extremity Assessment: Generalized weakness       Communication   Communication: Expressive difficulties;Receptive difficulties  Cognition Arousal/Alertness: Awake/alert Behavior During Therapy: WFL for tasks assessed/performed Overall Cognitive Status: History of cognitive impairments - at baseline  General Comments: overall pt responded well to gesturing and tactile cueing as well as some simple one-step commands.      General Comments      Exercises     Assessment/Plan    PT Assessment Patient needs continued PT services  PT Problem List Decreased balance;Decreased mobility;Decreased coordination;Decreased cognition;Decreased safety  awareness       PT Treatment Interventions DME instruction;Gait training;Stair training;Functional mobility training;Therapeutic activities;Therapeutic exercise;Balance training;Patient/family education;Neuromuscular re-education;Cognitive remediation    PT Goals (Current goals can be found in the Care Plan section)  Acute Rehab PT Goals Patient Stated Goal: unable to state PT Goal Formulation: Patient unable to participate in goal setting Time For Goal Achievement: 12/14/17 Potential to Achieve Goals: Good    Frequency Min 3X/week   Barriers to discharge        Co-evaluation               AM-PAC PT "6 Clicks" Daily Activity  Outcome Measure Difficulty turning over in bed (including adjusting bedclothes, sheets and blankets)?: A Little Difficulty moving from lying on back to sitting on the side of the bed? : A Little Difficulty sitting down on and standing up from a chair with arms (e.g., wheelchair, bedside commode, etc,.)?: A Little Help needed moving to and from a bed to chair (including a wheelchair)?: A Little Help needed walking in hospital room?: A Little Help needed climbing 3-5 steps with a railing? : A Lot 6 Click Score: 17    End of Session Equipment Utilized During Treatment: Gait belt Activity Tolerance: Patient tolerated treatment well Patient left: in chair;with call bell/phone within reach;with chair alarm set Nurse Communication: Mobility status PT Visit Diagnosis: Other abnormalities of gait and mobility (R26.89)    Time: [id number] PT Time Calculation (min) (ACUTE ONLY): 10 min   Charges:   PT Evaluation $PT Eval Moderate Complexity: 1 Mod     PT G Codes:        CraneJennifer Jaun Galluzzo, PT, DPT [phone]   Alessandra BevelsJennifer M Milanna Kozlov 11/30/2017, 2:45 PM

## 2017-12-02 ENCOUNTER — Telehealth: Payer: Self-pay | Admitting: *Deleted

## 2017-12-02 NOTE — Telephone Encounter (Signed)
Transition Care Management Follow-up Telephone Call  Per Discharge Summary: Admit date: 11/28/2017 Discharge date: 11/30/2017  Admitted From: Home  Disposition:  Home   Recommendations for Outpatient Follow-up:  1. Follow up with PCP within 1 month if able   Home Health: Yes  Equipment/Devices: None  Discharge Condition: Fair  CODE STATUS: DO NOT RESUSCITATE Diet recommendation: Regular, thin liquids  --  Called [place] w/ patient's sister [place]    How have you been since you were released from the hospital? "She's been doing pretty good. They told me she stayed up all night at the nurse's desk one night and was going into different patient's room but it looks like since she got home she's just been dead tired. She's been sleeping until 10-11am and we wake her up and give her breakfast and she's been eating, but like I said, you have to constantly remind her to drink."   Do you understand why you were in the hospital? yes   Do you understand the discharge instructions? yes   Where were you discharged to? Home   Items Reviewed:  Medications reviewed: yes  Allergies reviewed: yes  Dietary changes reviewed: yes  Referrals reviewed: yes, home health   Functional Questionnaire:   Activities of Daily Living (ADLs):   She states they are independent in the following: ambulation, feeding and dressing States they require assistance with the following: bathing and hygiene, continence, grooming and toileting   Any transportation issues/concerns?: no   Any patient concerns? Yes, they would like to consider facility placement   Confirmed importance and date/time of follow-up visits scheduled yes  Provider Appointment booked with Dr. Betty [name] 12/13/17 @ 2:30pm  Confirmed with patient if condition begins to worsen call PCP or go to the ER.  Patient was given the office number and encouraged to call back with question or concerns.  : yes

## 2017-12-03 ENCOUNTER — Telehealth: Payer: Self-pay | Admitting: Family Medicine

## 2017-12-03 NOTE — Telephone Encounter (Signed)
Copied from CRM [phone]#[id number]. Topic: Inquiry >> Dec 02, 2017  4:09 PM Windy KalataMichael, Taylor L, NT wrote: Reason for CRM: Edson SnowballShana is calling from [facility]. Caregiver states she can walk on her on and does not need home health. Does not require assist with eating or walking. She is non verbal but is depend in everything else. She is going to go ahead and cancel the referral and if anything changes she will let Dr. [name] know  Edson SnowballShana [phone]

## 2017-12-03 NOTE — Telephone Encounter (Signed)
Routed to Dr. Jordan for review. 

## 2017-12-13 ENCOUNTER — Inpatient Hospital Stay: Payer: Self-pay | Admitting: Family Medicine

## 2017-12-26 NOTE — Progress Notes (Deleted)
HPI:   Emily Curtis Emily Curtis Curtis is a 67 y.o. female, who is here today for follow up on recent hospital discharge.  She 11/28/2017 and discharged on 11/30/2017.  She was taken to the hospital via EMS by family members after she was fine on the floor, unable to stand after a fall. She was hypotensive upon arrival. Dx with severe dehydration.  Home health was arranged to start PT but caregiver did not think it was necessary.    [4+ HPI elements (or status of 3 or more chronic diseases)] ***      Review of Systems  [review of 2 to 9 systems] ***  Current Outpatient Medications on File Prior to Visit  Medication Sig Dispense Refill  . acetaminophen (TYLENOL) 325 MG tablet Take 325-650 mg by mouth every 6 (six) hours as needed (for headaches).    . cetirizine (ZYRTEC) 10 MG tablet Take 10 mg by mouth daily.     Marland Kitchen. donepezil (ARICEPT) 10 MG tablet TAKE 1 TABLET BY MOUTH AT BEDTIME (Patient taking differently: Take 10 mg at bedtime) 90 tablet 1  . pantoprazole (PROTONIX) 20 MG tablet Take 1 tablet (20 mg total) by mouth daily. 30 tablet 0  . ranitidine (ZANTAC) 150 MG tablet Take 150 mg by mouth 2 (two) times daily as needed for heartburn.     No current facility-administered medications on file prior to visit.      Past Medical History:  Diagnosis Date  . Alzheimer's dementia   . Frequent headaches   . GERD (gastroesophageal reflux disease)    Allergies  Allergen Reactions  . Penicillins Hives and Other (See Comments)    Welts, also Has patient had a PCN reaction causing immediate rash, facial/tongue/throat swelling, SOB or lightheadedness with hypotension: Yes Has patient had a PCN reaction causing severe rash involving mucus membranes or skin necrosis: Unk Has patient had a PCN reaction that required hospitalization: No Has patient had a PCN reaction occurring within the last 10 years: No If all of the above answers are "NO", then may proceed with Cephalosporin use.     Social History   Socioeconomic History  . Marital status: Widowed    Spouse name: Not on file  . Number of children: Not on file  . Years of education: Not on file  . Highest education level: Not on file  Social Needs  . Financial resource strain: Not on file  . Food insecurity - worry: Not on file  . Food insecurity - inability: Not on file  . Transportation needs - medical: Not on file  . Transportation needs - non-medical: Not on file  Occupational History  . Not on file  Tobacco Use  . Smoking status: Never Smoker  . Smokeless tobacco: Never Used  Substance and Sexual Activity  . Alcohol use: No  . Drug use: No  . Sexual activity: No  Other Topics Concern  . Not on file  Social History Narrative  . Not on file    There were no vitals filed for this visit. There is no height or weight on file to calculate BMI.      Physical Exam  [12+ exam elements] ***  ASSESSMENT AND PLAN:   Ms. Emily Curtis was seen today for *** months follow-up.   There are no diagnoses linked to this encounter.         -Ms. Emily Curtis was advised to return sooner than planned today if new concerns arise.  Yumi Insalaco G. [place], MD  [facility]. [place] office.

## 2017-12-27 ENCOUNTER — Inpatient Hospital Stay: Payer: Self-pay | Admitting: Family Medicine

## 2018-01-07 ENCOUNTER — Inpatient Hospital Stay: Payer: Self-pay | Admitting: Family Medicine

## 2018-01-07 NOTE — Progress Notes (Deleted)
    HPI:   [name] is a 66 y.o. female, who is here today for follow up on recent hospital discharge.  She 11/28/2017 and discharged on 11/30/2017.  She was taken to the hospital via EMS by family members after she was fine on the floor, unable to stand after a fall. She was hypotensive upon arrival. Dx with severe dehydration.  Home health was arranged to start PT but caregiver did not think it was necessary.    [4+ HPI elements (or status of 3 or more chronic diseases)] ***      Review of Systems  [review of 2 to 9 systems] ***  Current Outpatient Medications on File Prior to Visit  Medication Sig Dispense Refill  . acetaminophen (TYLENOL) 325 MG tablet Take 325-650 mg by mouth every 6 (six) hours as needed (for headaches).    . cetirizine (ZYRTEC) 10 MG tablet Take 10 mg by mouth daily.     . donepezil (ARICEPT) 10 MG tablet TAKE 1 TABLET BY MOUTH AT BEDTIME (Patient taking differently: Take 10 mg at bedtime) 90 tablet 1  . pantoprazole (PROTONIX) 20 MG tablet Take 1 tablet (20 mg total) by mouth daily. 30 tablet 0  . ranitidine (ZANTAC) 150 MG tablet Take 150 mg by mouth 2 (two) times daily as needed for heartburn.     No current facility-administered medications on file prior to visit.      Past Medical History:  Diagnosis Date  . Alzheimer's dementia   . Frequent headaches   . GERD (gastroesophageal reflux disease)    Allergies  Allergen Reactions  . Penicillins Hives and Other (See Comments)    Welts, also Has patient had a PCN reaction causing immediate rash, facial/tongue/throat swelling, SOB or lightheadedness with hypotension: Yes Has patient had a PCN reaction causing severe rash involving mucus membranes or skin necrosis: Unk Has patient had a PCN reaction that required hospitalization: No Has patient had a PCN reaction occurring within the last 10 years: No If all of the above answers are "NO", then may proceed with Cephalosporin use.     Social History   Socioeconomic History  . Marital status: Widowed    Spouse name: Not on file  . Number of children: Not on file  . Years of education: Not on file  . Highest education level: Not on file  Social Needs  . Financial resource strain: Not on file  . Food insecurity - worry: Not on file  . Food insecurity - inability: Not on file  . Transportation needs - medical: Not on file  . Transportation needs - non-medical: Not on file  Occupational History  . Not on file  Tobacco Use  . Smoking status: Never Smoker  . Smokeless tobacco: Never Used  Substance and Sexual Activity  . Alcohol use: No  . Drug use: No  . Sexual activity: No  Other Topics Concern  . Not on file  Social History Narrative  . Not on file    There were no vitals filed for this visit. There is no height or weight on file to calculate BMI.      Physical Exam  [12+ exam elements] ***  ASSESSMENT AND PLAN:   Ms. Emily Curtis was seen today for *** months follow-up.   There are no diagnoses linked to this encounter.         -Ms. Emily Curtis was advised to return sooner than planned today if new concerns arise.         Sherrica Niehaus G. [place], MD  [facility]. [place] office.

## 2018-01-12 NOTE — Progress Notes (Signed)
HPI:   [name] is a 67 y.o. female, who is here today with her nephew for follow up on recent hospital discharge. Her nephew provides history and her sister on the phone.  She 11/28/2017 and discharged on 11/30/2017.  She was taken to the hospital via EMS by family members after she was fine on the floor, unable to stand after a fall. She was hypotensive upon arrival. Dx with severe dehydration.  Home health was arranged to start PT but caregiver did not think it was necessary.  According to her nephew, she got dehydrated because she did not like the drinks in the house and she does not like water. She is drinking more fluids, found a soda she loves, Cheer wene ,she drinks about 4-5 cups daily.  She is sleeping better, not longer on Trazodone. Alzheimer's disease,she is on Aricept 10 mg daily. She is tolerating medications well. According to nephew problem seems to be getting worse, she still remembers family's faces.  She is not verbal and frequently she is trying to leave the house.   HypoCa++ (alb 29 and Ca++ 8.0).  Brain MRI in 10/2015: Unenhanced sequences reveal no mass lesion, acute parenchymalhemorrhage or acute infarction.    Posterior fossa and sinuses unremarkable.  Moderate to severe atrophy is present particularly involving the  temporal lobes. Moderate prolonged T2 signal periventricularwhite matter   Noted skin lesion on forehead. Nephew is not sure for how long lesion has been present and not sure about growth. She does not have to have symptoms, not bothering her.    Review of Systems  Constitutional: Negative for activity change, appetite change and fatigue.  HENT: Negative for mouth sores and sore throat.   Respiratory: Negative for cough, shortness of breath and wheezing.   Cardiovascular: Negative for leg swelling.  Gastrointestinal: Negative for abdominal pain and nausea.  Endocrine: Negative for cold intolerance, heat  intolerance, polydipsia, polyphagia and polyuria.  Genitourinary: Negative for decreased urine volume, dysuria and hematuria.  Musculoskeletal: Negative for gait problem and myalgias.  Skin: Positive for rash. Negative for wound.  Neurological: Negative for syncope, weakness and headaches.  Psychiatric/Behavioral: Positive for confusion. Negative for hallucinations and sleep disturbance. The patient is nervous/anxious.       Current Outpatient Medications on File Prior to Visit  Medication Sig Dispense Refill  . acetaminophen (TYLENOL) 325 MG tablet Take 325-650 mg by mouth every 6 (six) hours as needed (for headaches).    . cetirizine (ZYRTEC) 10 MG tablet Take 10 mg by mouth daily.     Marland Kitchen donepezil (ARICEPT) 10 MG tablet TAKE 1 TABLET BY MOUTH AT BEDTIME (Patient taking differently: Take 10 mg at bedtime) 90 tablet 1  . pantoprazole (PROTONIX) 20 MG tablet Take 1 tablet (20 mg total) by mouth daily. 30 tablet 0  . ranitidine (ZANTAC) 150 MG tablet Take 150 mg by mouth 2 (two) times daily as needed for heartburn.     No current facility-administered medications on file prior to visit.      Past Medical History:  Diagnosis Date  . Alzheimer's dementia   . Frequent headaches   . GERD (gastroesophageal reflux disease)    Allergies  Allergen Reactions  . Penicillins Hives and Other (See Comments)    Welts, also Has patient had a PCN reaction causing immediate rash, facial/tongue/throat swelling, SOB or lightheadedness with hypotension: Yes Has patient had a PCN reaction causing severe rash involving mucus membranes or skin necrosis: Unk Has  patient had a PCN reaction that required hospitalization: No Has patient had a PCN reaction occurring within the last 10 years: No If all of the above answers are "NO", then may proceed with Cephalosporin use.     Social History   Socioeconomic History  . Marital status: Widowed    Spouse name: None  . Number of children: None  . Years of  education: None  . Highest education level: None  Social Needs  . Financial resource strain: None  . Food insecurity - worry: None  . Food insecurity - inability: None  . Transportation needs - medical: None  . Transportation needs - non-medical: None  Occupational History  . None  Tobacco Use  . Smoking status: Never Smoker  . Smokeless tobacco: Never Used  Substance and Sexual Activity  . Alcohol use: No  . Drug use: No  . Sexual activity: No  Other Topics Concern  . None  Social History Narrative  . None    Vitals:   01/13/18 1527  BP: 132/77  Pulse: 85  Resp: 16  Temp: 99.4 F (37.4 C)  SpO2: 97%   Body mass index is 28.32 kg/m.    Physical Exam  Nursing note and vitals reviewed. Constitutional: She appears well-developed. She is cooperative. No distress.  HENT:  Head: Normocephalic and atraumatic.  Mouth/Throat: Oropharynx is clear and moist and mucous membranes are normal.  Eyes: Conjunctivae are normal. Pupils are equal, round, and reactive to light.  Cardiovascular: Normal rate and regular rhythm.  No murmur heard. Pulses:      Dorsalis pedis pulses are 2+ on the right side, and 2+ on the left side.  Respiratory: Effort normal and breath sounds normal. No respiratory distress.  GI: Soft. She exhibits no mass. There is no tenderness.  Musculoskeletal: She exhibits no edema or tenderness.  Lymphadenopathy:    She has no cervical adenopathy.  Neurological: She is alert. She has normal strength. Gait normal.  Skin: Skin is warm. Lesion noted. No erythema.  Scattered mildly hyperpigmented and raised lesions on face. A 1.5 cm rounded lesion on right side of forehead,erythmatose,no tender.   Psychiatric: Her mood appears anxious. She is not agitated and not aggressive. Cognition and memory are impaired. She is noncommunicative.  Poorly groomed, poor eye contact. During the visit she moves constantly.Walking and,opening drawers/cabinets. She tries to leave  the room several times. She is attentive.     ASSESSMENT AND PLAN:   Emily Curtis was seen today for hospitalization follow up.  Diagnoses and all orders for this visit:  Lab Results  Component Value Date   CREATININE 0.82 01/13/2018   BUN 14 01/13/2018   NA 140 01/13/2018   K 3.7 01/13/2018   CL 106 01/13/2018   CO2 25 01/13/2018    Early onset Alzheimer's disease with behavioral disturbance  Sister has reported past neurology evaluation and work up done. After discussion of some benefits of Namenda, her nephew and sister agree with trying. No changes in Aricept. OTC Vit E 400 U daily recommended. She needs 24/7 care and supervision.  Today sister agrees with neuro referral.  -     memantine (NAMENDA TITRATION PAK) tablet pack; 5 mg/day for =1 week; 5 mg twice daily for =1 week; 15 mg/day given in 5 mg and 10 mg separated doses for =1 week; then 10 mg twice daily -     Ambulatory referral to Neurology  Skin lesion of face  ? SCC. Dermatology evaluation  will be arranged.  -     Ambulatory referral to Dermatology  Hypocalcemia  Further recommendations will be given according to lab results.  -     Basic metabolic panel -     VITAMIN D 25 Hydroxy (Vit-D Deficiency, Fractures)     -Emily Curtis was advised to return sooner than planned today if new concerns arise.       Betty G. [name], MD  [facility]. [place] office.

## 2018-01-13 ENCOUNTER — Ambulatory Visit (INDEPENDENT_AMBULATORY_CARE_PROVIDER_SITE_OTHER): Payer: Self-pay | Admitting: Family Medicine

## 2018-01-13 ENCOUNTER — Encounter: Payer: Self-pay | Admitting: Family Medicine

## 2018-01-13 VITALS — BP 132/77 | HR 85 | Temp 99.4°F | Resp 16 | Ht 64.0 in | Wt 165.0 lb

## 2018-01-13 DIAGNOSIS — F02818 Dementia in other diseases classified elsewhere, unspecified severity, with other behavioral disturbance: Secondary | ICD-10-CM

## 2018-01-13 DIAGNOSIS — F0281 Dementia in other diseases classified elsewhere with behavioral disturbance: Secondary | ICD-10-CM

## 2018-01-13 DIAGNOSIS — G300 Alzheimer's disease with early onset: Secondary | ICD-10-CM

## 2018-01-13 DIAGNOSIS — L989 Disorder of the skin and subcutaneous tissue, unspecified: Secondary | ICD-10-CM

## 2018-01-13 MED ORDER — MEMANTINE HCL 28 X 5 MG & 21 X 10 MG PO TABS: ORAL_TABLET | ORAL | 12 refills | Status: DC

## 2018-01-13 NOTE — Patient Instructions (Addendum)
A few things to remember from today's visit:   Early onset Alzheimer's disease with behavioral disturbance - Plan: memantine (NAMENDA TITRATION PAK) tablet pack, Ambulatory referral to Neurology  Skin lesion of face - Plan: Ambulatory referral to Dermatology  Hypocalcemia - Plan: Basic metabolic panel, VITAMIN D 25 Hydroxy (Vit-D Deficiency, Fractures)  Namenda added . Neurology referral placed. Dermatology referral to check lesion on forehead.  Vit E 400 U daily.   Please be sure medication list is accurate. If a new problem present, please set up appointment sooner than planned today.

## 2018-01-14 ENCOUNTER — Encounter: Payer: Self-pay | Admitting: Neurology

## 2018-01-14 LAB — BASIC METABOLIC PANEL
BUN: 14 mg/dL (ref 6–23)
CHLORIDE: 106 meq/L (ref 96–112)
CO2: 25 meq/L (ref 19–32)
Calcium: 9 mg/dL (ref 8.4–10.5)
Creatinine, Ser: 0.82 mg/dL (ref 0.40–1.20)
GFR: 74.49 mL/min (ref >60.00)
GLUCOSE: 101 mg/dL — AB (ref 70–99)
POTASSIUM: 3.7 meq/L (ref 3.5–5.1)
Sodium: 140 mEq/L (ref 135–145)

## 2018-01-14 LAB — VITAMIN D 25 HYDROXY (VIT D DEFICIENCY, FRACTURES): VITD: 8.84 ng/mL — ABNORMAL LOW (ref 30.00–100.00)

## 2018-01-20 ENCOUNTER — Other Ambulatory Visit: Payer: Self-pay | Admitting: *Deleted

## 2018-01-20 DIAGNOSIS — G300 Alzheimer's disease with early onset: Principal | ICD-10-CM

## 2018-01-20 DIAGNOSIS — F0281 Dementia in other diseases classified elsewhere with behavioral disturbance: Secondary | ICD-10-CM

## 2018-01-20 MED ORDER — VITAMIN D (ERGOCALCIFEROL) 1.25 MG (50000 UNIT) PO CAPS: 50000.0000 [IU] | ORAL_CAPSULE | ORAL | 1 refills | Status: AC

## 2018-01-25 ENCOUNTER — Emergency Department (HOSPITAL_COMMUNITY)
Admission: EM | Admit: 2018-01-25 | Discharge: 2018-01-25 | Disposition: A | Discharge status: Home / Self Care | Payer: Medicaid Other | Attending: Emergency Medicine | Admitting: Emergency Medicine

## 2018-01-25 ENCOUNTER — Emergency Department (HOSPITAL_COMMUNITY): Payer: Medicaid Other

## 2018-01-25 ENCOUNTER — Encounter (HOSPITAL_COMMUNITY): Payer: Self-pay

## 2018-01-25 ENCOUNTER — Other Ambulatory Visit: Payer: Self-pay

## 2018-01-25 DIAGNOSIS — Z79899 Other long term (current) drug therapy: Secondary | ICD-10-CM | POA: Insufficient documentation

## 2018-01-25 DIAGNOSIS — F0280 Dementia in other diseases classified elsewhere without behavioral disturbance: Secondary | ICD-10-CM | POA: Diagnosis not present

## 2018-01-25 DIAGNOSIS — R55 Syncope and collapse: Secondary | ICD-10-CM | POA: Diagnosis present

## 2018-01-25 DIAGNOSIS — G309 Alzheimer's disease, unspecified: Secondary | ICD-10-CM | POA: Insufficient documentation

## 2018-01-25 LAB — BASIC METABOLIC PANEL
ANION GAP: 10 (ref 5–15)
BUN: 14 mg/dL (ref 6–20)
CO2: 20 mmol/L — ABNORMAL LOW (ref 22–32)
CREATININE: 1.13 mg/dL — AB (ref 0.44–1.00)
Calcium: 8.6 mg/dL — ABNORMAL LOW (ref 8.9–10.3)
Chloride: 105 mmol/L (ref 101–111)
GFR, EST AFRICAN AMERICAN: 58 mL/min — AB (ref >60)
GFR, EST NON AFRICAN AMERICAN: 50 mL/min — AB (ref >60)
GLUCOSE: 148 mg/dL — AB (ref 65–99)
Potassium: 3.4 mmol/L — ABNORMAL LOW (ref 3.5–5.1)
Sodium: 135 mmol/L (ref 135–145)

## 2018-01-25 LAB — CBC
HCT: 28 % — ABNORMAL LOW (ref 36.0–46.0)
HEMOGLOBIN: 9 g/dL — AB (ref 12.0–15.0)
MCH: 26.9 pg (ref 26.0–34.0)
MCHC: 32.1 g/dL (ref 30.0–36.0)
MCV: 83.6 fL (ref 78.0–100.0)
Platelets: 260 10*3/uL (ref 150–400)
RBC: 3.35 MIL/uL — AB (ref 3.87–5.11)
RDW: 13.4 % (ref 11.5–15.5)
WBC: 12.1 10*3/uL — ABNORMAL HIGH (ref 4.0–10.5)

## 2018-01-25 LAB — I-STAT CHEM 8, ED
BUN: 14 mg/dL (ref 6–20)
CHLORIDE: 106 mmol/L (ref 101–111)
Calcium, Ion: 1.12 mmol/L — ABNORMAL LOW (ref 1.15–1.40)
Creatinine, Ser: 1.1 mg/dL — ABNORMAL HIGH (ref 0.44–1.00)
GLUCOSE: 143 mg/dL — AB (ref 65–99)
HCT: 27 % — ABNORMAL LOW (ref 36.0–46.0)
Hemoglobin: 9.2 g/dL — ABNORMAL LOW (ref 12.0–15.0)
POTASSIUM: 3.6 mmol/L (ref 3.5–5.1)
Sodium: 138 mmol/L (ref 135–145)
TCO2: 21 mmol/L — ABNORMAL LOW (ref 22–32)

## 2018-01-25 LAB — URINALYSIS, ROUTINE W REFLEX MICROSCOPIC
BILIRUBIN URINE: NEGATIVE
Glucose, UA: NEGATIVE mg/dL
Ketones, ur: NEGATIVE mg/dL
Leukocytes, UA: NEGATIVE
NITRITE: NEGATIVE
Protein, ur: NEGATIVE mg/dL
RBC / HPF: NONE SEEN RBC/hpf (ref 0–5)
SPECIFIC GRAVITY, URINE: 1.002 — AB (ref 1.005–1.030)
Squamous Epithelial / LPF: NONE SEEN
pH: 6 (ref 5.0–8.0)

## 2018-01-25 LAB — I-STAT TROPONIN, ED: TROPONIN I, POC: 0 ng/mL (ref 0.00–0.08)

## 2018-01-25 MED ORDER — SODIUM CHLORIDE 0.9 % IV BOLUS (SEPSIS)
1000.0000 mL | Freq: Once | INTRAVENOUS | Status: AC
  Administered 2018-01-25: 1000 mL via INTRAVENOUS

## 2018-01-25 MED ORDER — POTASSIUM CHLORIDE CRYS ER 20 MEQ PO TBCR
20.0000 meq | EXTENDED_RELEASE_TABLET | Freq: Once | ORAL | Status: AC
  Administered 2018-01-25: 20 meq via ORAL
  Filled 2018-01-25: qty 1

## 2018-01-25 MED ORDER — SODIUM CHLORIDE 0.9 % IV SOLN
INTRAVENOUS | Status: DC
  Administered 2018-01-25: 16:00:00 via INTRAVENOUS

## 2018-01-25 NOTE — Discharge Instructions (Addendum)
Please stay hydrated and follow up with your doctor for further care.   Drink adequate fluids, supplement nutrition as need with nutritious shakes like Boost or Ensure.  From today's lab tests, your potassium level is slightly low (3.4) - eat plenty of fruits and vegetables, and follow up with your doctor.   Follow up with primary care doctor in the coming week.  Return to ER if worse, new symptoms, high fevers, increased trouble breathing, other concern.

## 2018-01-25 NOTE — ED Notes (Signed)
Patient transported to CT 

## 2018-01-25 NOTE — ED Triage Notes (Signed)
Pt presents to the ed with complaints of having a syncopal episode at home. Per ems she has hx of this with dehydration. bp in the 70's in route to ed.  Pt is non-verbal which is baseline for the past 9 months.

## 2018-01-25 NOTE — ED Provider Notes (Signed)
[facility] EMERGENCY DEPARTMENT Provider Note   CSN: [id number] Arrival date & time: 01/25/18  1359     History   Chief Complaint Chief Complaint  Patient presents with  . Loss of Consciousness    HPI Emily Curtis is a 67 y.o. female.  HPI   67 year old female with history of Alzheimer's, frequent headache, GERD, recurrent dehydration brought here via EMS from home for evaluation of a recent syncopal episode.  History obtained through family member who is at bedside.  Patient lives with her sister and her nephew.  She has advanced dementia.  She normally pacing around the house and would only stop to eat breakfast lunch and dinner.  Today, after eating breakfast which she had a full course, patient was pacing the house but then she stopped in front of her sister and had a near syncopal episode.  Her sister and her nephew did try to get her up and gave her something to drink but they were concerned for potential dehydration since patient has had syncope/dehydration in the past.  This is fourth episode within the past 6 months according to her sister.  Family mentioned that patient has been eating and drinking fine and have not had any specific complaint.  No report of any medication changes.  History is limited as patient is severely demented.  Last admitted for same was 11/30/2017.   Past Medical History:  Diagnosis Date  . Alzheimer's dementia   . Frequent headaches   . GERD (gastroesophageal reflux disease)     Patient Active Problem List   Diagnosis Date Noted  . GERD (gastroesophageal reflux disease) 11/28/2017  . Syncope 11/28/2017  . Fall 11/28/2017  . Severe dehydration 11/28/2017  . Alzheimer's dementia with behavioral disturbance 12/18/2016  . 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 No pertinent surgical history.  OB History    No data available       Home Medications    Prior to Admission medications   Medication Sig Start  Date End Date Taking? Authorizing Provider  acetaminophen (TYLENOL) 325 MG tablet Take 325-650 mg by mouth every 6 (six) hours as needed (for headaches).    [provider]  cetirizine (ZYRTEC) 10 MG tablet Take 10 mg by mouth daily.     [provider]  donepezil (ARICEPT) 10 MG tablet TAKE 1 TABLET BY MOUTH AT BEDTIME Patient taking differently: Take 10 mg at bedtime 11/18/17   [place], Betty G, MD  memantine [facility] TITRATION PAK) tablet pack 5 mg/day for =1 week; 5 mg twice daily for =1 week; 15 mg/day given in 5 mg and 10 mg separated doses for =1 week; then 10 mg twice daily 01/13/18   [place], Betty G, MD  pantoprazole (PROTONIX) 20 MG tablet Take 1 tablet (20 mg total) by mouth daily. 12/05/16   Arby Barrette, MD  ranitidine (ZANTAC) 150 MG tablet Take 150 mg by mouth 2 (two) times daily as needed for heartburn.    [provider]  Vitamin D, Ergocalciferol, (DRISDOL) 50000 units CAPS capsule Take 1 capsule (50,000 Units total) by mouth once a week. FOR 16 WEEKS AND THEN ONCE A WEEK EVERY 2 WEEKS 01/20/18 03/21/18  [place], Betty G, MD    Family History Family History  Problem Relation Age of Onset  . Cancer Mother   . Heart disease Mother   . Stroke Mother   . Hypertension Mother   . Heart disease Father   . Stroke Father   .  Hypertension Father     Social History Social History   Tobacco Use  . Smoking status: Never Smoker  . Smokeless tobacco: Never Used  Substance Use Topics  . Alcohol use: No  . Drug use: No     Allergies   Penicillins   Review of Systems Review of Systems  Unable to perform ROS: Dementia     Physical Exam Updated Vital Signs BP 100/60   Pulse 70   Temp 98.5 F (36.9 C)   Resp 20   Wt 74.8 kg (165 lb)   SpO2 100%   BMI 28.32 kg/m   Physical Exam  Constitutional:  Pleasantly demented female smiling in no acute discomfort  HENT:  Head: Normocephalic and atraumatic.  No hemotympanum, no septal hematoma, no  malocclusion, no midface tenderness  Eyes: Pupils are equal, round, and reactive to light.  Neck: Normal range of motion. Neck supple.  Cardiovascular: Normal rate and regular rhythm.  Pulmonary/Chest: Effort normal and breath sounds normal.  Abdominal: Soft. She exhibits no distension. There is no tenderness.  Neurological: She is alert.  Nursing note and vitals reviewed.    ED Treatments / Results  Labs (all labs ordered are listed, but only abnormal results are displayed) Labs Reviewed  BASIC METABOLIC PANEL - Abnormal; Notable for the following components:      Result Value   Potassium 3.4 (*)    CO2 20 (*)    Glucose, Bld 148 (*)    Creatinine, Ser 1.13 (*)    Calcium 8.6 (*)    GFR calc non Af Amer 50 (*)    GFR calc Af Amer 58 (*)    All other components within normal limits  CBC - Abnormal; Notable for the following components:   WBC 12.1 (*)    RBC 3.35 (*)    Hemoglobin 9.0 (*)    HCT 28.0 (*)    All other components within normal limits  I-STAT CHEM 8, ED - Abnormal; Notable for the following components:   Creatinine, Ser 1.10 (*)    Glucose, Bld 143 (*)    Calcium, Ion 1.12 (*)    TCO2 21 (*)    Hemoglobin 9.2 (*)    HCT 27.0 (*)    All other components within normal limits  URINALYSIS, ROUTINE W REFLEX MICROSCOPIC  I-STAT TROPONIN, ED  CBG MONITORING, ED  I-STAT TROPONIN, ED    EKG  EKG Interpretation None     ED ECG REPORT   Date: 01/25/2018  Rate: 70  Rhythm: normal sinus rhythm  QRS Axis: normal  Intervals: normal  ST/T Wave abnormalities: normal  Conduction Disutrbances:none  Narrative Interpretation: low voltage in the precordial leads  Old EKG Reviewed: unchanged  I have personally reviewed the EKG tracing and agree with the computerized printout as noted.   Radiology No results found.  Procedures Procedures (including critical care time)  Medications Ordered in ED Medications  sodium chloride 0.9 % bolus 1,000 mL (not  administered)    And  0.9 %  sodium chloride infusion (not administered)     Initial Impression / Assessment and Plan / ED Course  I have reviewed the triage vital signs and the nursing notes.  Pertinent labs & imaging results that were available during my care of the patient were reviewed by me and considered in my medical decision making (see chart for details).     BP 100/60   Pulse 70   Temp 98.5 F (36.9 C)   Resp  20   Wt 74.8 kg (165 lb)   SpO2 100%   BMI 28.32 kg/m    Final Clinical Impressions(s) / ED Diagnoses   Final diagnoses:  Near syncope    ED Discharge Orders    None     3:02 PM Patient has advanced dementia and has been nonverbal for more than a year who is here for a near syncopal episode witnessed by a sister who she lives with.  She has had extensive workup for her fall/syncope in the past, last ER visit was November 30, 2017 for same and was admitted overnight for IV fluid hydration.  Today EMS found the patient was hypotensive, with blood pressure 70s systolic.  Patient did not receive IV fluid prior to arrival. Her BP have been stable.  Currently her blood pressure is 100/60.  No evidence of injury, no reproducible pain.  She is afebrile.  She is pleasantly demented but does not follow any direction.  3:33 PM Normal orthostatic vital sign.  UA appears clear when obtained. Currently awaits result.  Her labs are at baseline.  Her Creatine today is 1.13.  It was 0.82 12 days ago.  3:50 PM Currently awaits head CT scan.  If normal and UA normal, pt may be discharge.  Care discussed with Dr. Denton LankSteinl.     Fayrene Helperran, Halen Antenucci, PA-C 01/25/18 1553    Benjiman CorePickering, Nathan, MD 01/26/18 [phone]

## 2018-01-25 NOTE — ED Provider Notes (Signed)
Signed out by Dr Verta EllenPIckering/Tran PA to d/c to home when ua back.  ua is negative for infection. Patient is alert and content. Current bp 116/70. p 74, rr 14/ po 99% ra. Patient currently appears stable for d/c.      Cathren LaineSteinl, Joselin Crandell, MD 01/25/18 1630

## 2018-02-28 ENCOUNTER — Other Ambulatory Visit: Payer: Self-pay | Admitting: Family Medicine

## 2018-02-28 DIAGNOSIS — G309 Alzheimer's disease, unspecified: Principal | ICD-10-CM

## 2018-02-28 DIAGNOSIS — F0281 Dementia in other diseases classified elsewhere with behavioral disturbance: Secondary | ICD-10-CM

## 2018-03-03 ENCOUNTER — Other Ambulatory Visit: Payer: Self-pay | Admitting: *Deleted

## 2018-03-03 DIAGNOSIS — F0281 Dementia in other diseases classified elsewhere with behavioral disturbance: Secondary | ICD-10-CM

## 2018-03-03 DIAGNOSIS — G300 Alzheimer's disease with early onset: Principal | ICD-10-CM

## 2018-03-03 MED ORDER — MEMANTINE HCL 10 MG PO TABS: 10.0000 mg | ORAL_TABLET | Freq: Two times a day (BID) | ORAL | 2 refills | Status: DC

## 2018-03-03 MED ORDER — MEMANTINE HCL 28 X 5 MG & 21 X 10 MG PO TABS: ORAL_TABLET | ORAL | 2 refills | Status: DC

## 2018-03-05 ENCOUNTER — Encounter (HOSPITAL_COMMUNITY): Payer: Self-pay

## 2018-03-05 ENCOUNTER — Emergency Department (HOSPITAL_COMMUNITY): Payer: Self-pay

## 2018-03-05 ENCOUNTER — Inpatient Hospital Stay (HOSPITAL_COMMUNITY)
Admission: EM | Admit: 2018-03-05 | Discharge: 2018-03-07 | DRG: 312 | Disposition: A | Discharge status: Home Health | Payer: Self-pay | Attending: Internal Medicine | Admitting: Internal Medicine

## 2018-03-05 ENCOUNTER — Other Ambulatory Visit: Payer: Self-pay

## 2018-03-05 DIAGNOSIS — R55 Syncope and collapse: Principal | ICD-10-CM | POA: Diagnosis present

## 2018-03-05 DIAGNOSIS — E872 Acidosis: Secondary | ICD-10-CM | POA: Diagnosis present

## 2018-03-05 DIAGNOSIS — Z79899 Other long term (current) drug therapy: Secondary | ICD-10-CM

## 2018-03-05 DIAGNOSIS — D649 Anemia, unspecified: Secondary | ICD-10-CM | POA: Diagnosis present

## 2018-03-05 DIAGNOSIS — E860 Dehydration: Secondary | ICD-10-CM | POA: Diagnosis present

## 2018-03-05 DIAGNOSIS — F0281 Dementia in other diseases classified elsewhere with behavioral disturbance: Secondary | ICD-10-CM | POA: Diagnosis present

## 2018-03-05 DIAGNOSIS — E876 Hypokalemia: Secondary | ICD-10-CM | POA: Diagnosis present

## 2018-03-05 DIAGNOSIS — Z66 Do not resuscitate: Secondary | ICD-10-CM | POA: Diagnosis present

## 2018-03-05 DIAGNOSIS — E861 Hypovolemia: Secondary | ICD-10-CM

## 2018-03-05 DIAGNOSIS — I9589 Other hypotension: Secondary | ICD-10-CM

## 2018-03-05 DIAGNOSIS — F05 Delirium due to known physiological condition: Secondary | ICD-10-CM | POA: Diagnosis present

## 2018-03-05 DIAGNOSIS — G300 Alzheimer's disease with early onset: Secondary | ICD-10-CM

## 2018-03-05 DIAGNOSIS — D509 Iron deficiency anemia, unspecified: Secondary | ICD-10-CM | POA: Diagnosis present

## 2018-03-05 DIAGNOSIS — E538 Deficiency of other specified B group vitamins: Secondary | ICD-10-CM | POA: Diagnosis present

## 2018-03-05 DIAGNOSIS — K219 Gastro-esophageal reflux disease without esophagitis: Secondary | ICD-10-CM | POA: Diagnosis present

## 2018-03-05 DIAGNOSIS — G309 Alzheimer's disease, unspecified: Secondary | ICD-10-CM | POA: Diagnosis present

## 2018-03-05 DIAGNOSIS — D519 Vitamin B12 deficiency anemia, unspecified: Secondary | ICD-10-CM | POA: Diagnosis present

## 2018-03-05 DIAGNOSIS — F02818 Dementia in other diseases classified elsewhere, unspecified severity, with other behavioral disturbance: Secondary | ICD-10-CM | POA: Diagnosis present

## 2018-03-05 LAB — HEPATIC FUNCTION PANEL
ALT: 9 U/L — ABNORMAL LOW (ref 14–54)
AST: 13 U/L — AB (ref 15–41)
Albumin: 3 g/dL — ABNORMAL LOW (ref 3.5–5.0)
Alkaline Phosphatase: 83 U/L (ref 38–126)
BILIRUBIN TOTAL: 0.2 mg/dL — AB (ref 0.3–1.2)
Total Protein: 5.6 g/dL — ABNORMAL LOW (ref 6.5–8.1)

## 2018-03-05 LAB — BASIC METABOLIC PANEL
Anion gap: 9 (ref 5–15)
BUN: 13 mg/dL (ref 6–20)
CHLORIDE: 114 mmol/L — AB (ref 101–111)
CO2: 17 mmol/L — AB (ref 22–32)
CREATININE: 0.77 mg/dL (ref 0.44–1.00)
Calcium: 7.2 mg/dL — ABNORMAL LOW (ref 8.9–10.3)
GFR calc Af Amer: >60 mL/min (ref >60)
GFR calc non Af Amer: >60 mL/min (ref >60)
GLUCOSE: 103 mg/dL — AB (ref 65–99)
Potassium: 3.3 mmol/L — ABNORMAL LOW (ref 3.5–5.1)
SODIUM: 140 mmol/L (ref 135–145)

## 2018-03-05 LAB — I-STAT CHEM 8, ED
BUN: 13 mg/dL (ref 6–20)
CALCIUM ION: 1.04 mmol/L — AB (ref 1.15–1.40)
CREATININE: 0.7 mg/dL (ref 0.44–1.00)
Chloride: 112 mmol/L — ABNORMAL HIGH (ref 101–111)
Glucose, Bld: 101 mg/dL — ABNORMAL HIGH (ref 65–99)
HEMATOCRIT: 21 % — AB (ref 36.0–46.0)
HEMOGLOBIN: 7.1 g/dL — AB (ref 12.0–15.0)
Potassium: 3.4 mmol/L — ABNORMAL LOW (ref 3.5–5.1)
SODIUM: 143 mmol/L (ref 135–145)
TCO2: 19 mmol/L — AB (ref 22–32)

## 2018-03-05 LAB — URINALYSIS, ROUTINE W REFLEX MICROSCOPIC
Bilirubin Urine: NEGATIVE
Glucose, UA: NEGATIVE mg/dL
Hgb urine dipstick: NEGATIVE
KETONES UR: NEGATIVE mg/dL
Leukocytes, UA: NEGATIVE
Nitrite: NEGATIVE
PROTEIN: 30 mg/dL — AB
SQUAMOUS EPITHELIAL / LPF: NONE SEEN
Specific Gravity, Urine: 1.023 (ref 1.005–1.030)
pH: 5 (ref 5.0–8.0)

## 2018-03-05 LAB — CBC WITH DIFFERENTIAL/PLATELET
BASOS PCT: 0 %
Basophils Absolute: 0 10*3/uL (ref 0.0–0.1)
EOS ABS: 0 10*3/uL (ref 0.0–0.7)
Eosinophils Relative: 0 %
HCT: 24.1 % — ABNORMAL LOW (ref 36.0–46.0)
HEMOGLOBIN: 7.7 g/dL — AB (ref 12.0–15.0)
LYMPHS ABS: 1.7 10*3/uL (ref 0.7–4.0)
Lymphocytes Relative: 17 %
MCH: 25.4 pg — AB (ref 26.0–34.0)
MCHC: 32 g/dL (ref 30.0–36.0)
MCV: 79.5 fL (ref 78.0–100.0)
MONOS PCT: 3 %
Monocytes Absolute: 0.3 10*3/uL (ref 0.1–1.0)
NEUTROS PCT: 80 %
Neutro Abs: 7.8 10*3/uL — ABNORMAL HIGH (ref 1.7–7.7)
PLATELETS: 225 10*3/uL (ref 150–400)
RBC: 3.03 MIL/uL — ABNORMAL LOW (ref 3.87–5.11)
RDW: 14.9 % (ref 11.5–15.5)
WBC: 9.8 10*3/uL (ref 4.0–10.5)

## 2018-03-05 LAB — CBC
HCT: 22.7 % — ABNORMAL LOW (ref 36.0–46.0)
Hemoglobin: 7.2 g/dL — ABNORMAL LOW (ref 12.0–15.0)
MCH: 25.5 pg — AB (ref 26.0–34.0)
MCHC: 31.7 g/dL (ref 30.0–36.0)
MCV: 80.5 fL (ref 78.0–100.0)
PLATELETS: 199 10*3/uL (ref 150–400)
RBC: 2.82 MIL/uL — ABNORMAL LOW (ref 3.87–5.11)
RDW: 15 % (ref 11.5–15.5)
WBC: 7.9 10*3/uL (ref 4.0–10.5)

## 2018-03-05 LAB — RETICULOCYTES
RBC.: 3.03 MIL/uL — AB (ref 3.87–5.11)
Retic Count, Absolute: 48.5 10*3/uL (ref 19.0–186.0)
Retic Ct Pct: 1.6 % (ref 0.4–3.1)

## 2018-03-05 LAB — I-STAT CG4 LACTIC ACID, ED
LACTIC ACID, VENOUS: 2.14 mmol/L — AB (ref 0.5–1.9)
Lactic Acid, Venous: 1.33 mmol/L (ref 0.5–1.9)

## 2018-03-05 LAB — POC OCCULT BLOOD, ED: FECAL OCCULT BLD: NEGATIVE

## 2018-03-05 MED ORDER — SODIUM CHLORIDE 0.9 % IV BOLUS
1000.0000 mL | Freq: Once | INTRAVENOUS | Status: AC
  Administered 2018-03-05: 1000 mL via INTRAVENOUS

## 2018-03-05 MED ORDER — POTASSIUM CHLORIDE CRYS ER 20 MEQ PO TBCR
40.0000 meq | EXTENDED_RELEASE_TABLET | Freq: Once | ORAL | Status: DC
  Filled 2018-03-05: qty 2

## 2018-03-05 NOTE — H&P (Signed)
Algis GreenhouseCathy Lynch Lundberg NWG:[id number] DOB: 12/08/1950 DOA: 03/05/2018     PCP: [name], Betty G, MD   Outpatient Specialists:  NONE   Patient arrived to ER on 03/05/18 at 2000  Patient coming from:    home Lives  With family    Chief Complaint:  Chief Complaint  Patient presents with  . Loss of Consciousness    HPI: Emily Curtis is a 67 y.o. female with medical history significant of advanced dementia     Presented with  Episode of syncope happened at the store she was standing in line at pharmacy. Denies head injury. She set down on the floor. The family tried to get her up to the chair she lost conscience no seizure activity, no loss of urine. vomited after regained conscience.  No chest pain.  EMS arrival noted to be hypotensive down to 60s   improved after getting 500 cc normal saline At baseline she is pleasantly confused non verbal.  She follows instructions. She is able to walk.  No blood in stool no black stools. Reports good appetite.  Regarding pertinent Chronic problems: no hx of DM or CAD.    While in ER:   Following Medications were ordered in ER: Medications  sodium chloride 0.9 % bolus 1,000 mL (0 mLs Intravenous Stopped 03/05/18 2131)    Significant initial  Findings: Abnormal Labs Reviewed  BASIC METABOLIC PANEL - Abnormal; Notable for the following components:      Result Value   Potassium 3.3 (*)    Chloride 114 (*)    CO2 17 (*)    Glucose, Bld 103 (*)    Calcium 7.2 (*)    All other components within normal limits  CBC - Abnormal; Notable for the following components:   RBC 2.82 (*)    Hemoglobin 7.2 (*)    HCT 22.7 (*)    MCH 25.5 (*)    All other components within normal limits  URINALYSIS, ROUTINE W REFLEX MICROSCOPIC - Abnormal; Notable for the following components:   APPearance HAZY (*)    Protein, ur 30 (*)    Bacteria, UA RARE (*)    All other components within normal limits  I-STAT CG4 LACTIC ACID, ED - Abnormal; Notable for the  following components:   Lactic Acid, Venous 2.14 (*)    All other components within normal limits  I-STAT CHEM 8, ED - Abnormal; Notable for the following components:   Potassium 3.4 (*)    Chloride 112 (*)    Glucose, Bld 101 (*)    Calcium, Ion 1.04 (*)    TCO2 19 (*)    Hemoglobin 7.1 (*)    HCT 21.0 (*)    All other components within normal limits     Na 143 K 3.4  Cr    Stable,  Lab Results  Component Value Date   CREATININE 0.70 03/05/2018   CREATININE 0.77 03/05/2018   CREATININE 1.10 (H) 01/25/2018    hemoccult negative  WBC 7.9  HG/HCT   Down     Component Value Date/Time   HGB 7.1 (L) 03/05/2018 2107   HCT 21.0 (L) 03/05/2018 2107      Lactic Acid, Venous    Component Value Date/Time   LATICACIDVEN 2.14 (HH) 03/05/2018 2108      UA  no evidence of UTI     CXR - NON acute, cardiomegaly     ECG:  Personally reviewed by me showing: HR : 88 Rhythm: NSR  no evidence of ischemic changes [id number]     ED Triage Vitals [03/05/18 2004]  Enc Vitals Group     BP 111/64     Pulse Rate 77     Resp 18     Temp 97.8 F (36.6 C)     Temp src      SpO2 96 %     Weight 165 lb (74.8 kg)     Height 5' (1.524 m)     Head Circumference      Peak Flow      Pain Score 0     Pain Loc      Pain Edu?      Excl. in GC?   AVWU(98)@       Latest  Blood pressure 132/62, pulse 97, temperature 97.8 F (36.6 C), resp. rate 20, height 5' (1.524 m), weight 74.8 kg (165 lb), SpO2 98 %.     Hospitalist was called for admission for syncope   Review of Systems:    Pertinent positives include: fall  confusion  Constitutional:  No weight loss, night sweats, Fevers, chills, fatigue, weight loss  HEENT:  No headaches, Difficulty swallowing,Tooth/dental problems,Sore throat,  No sneezing, itching, ear ache, nasal congestion, post nasal drip,  Cardio-vascular:  No chest pain, Orthopnea, PND, anasarca, dizziness, palpitations.no Bilateral lower extremity  swelling  GI:  No heartburn, indigestion, abdominal pain, nausea, vomiting, diarrhea, change in bowel habits, loss of appetite, melena, blood in stool, hematemesis Resp:  no shortness of breath at rest. No dyspnea on exertion, No excess mucus, no productive cough, No non-productive cough, No coughing up of blood.No change in color of mucus.No wheezing. Skin:  no rash or lesions. No jaundice GU:  no dysuria, change in color of urine, no urgency or frequency. No straining to urinate.  No flank pain.  Musculoskeletal:  No joint pain or no joint swelling. No decreased range of motion. No back pain.  Psych:  No change in mood or affect. No depression or anxiety. No memory loss.  Neuro: no localizing neurological complaints, no tingling, no weakness, no double vision, no gait abnormality, no slurred speech,    As per HPI otherwise 10 point review of systems negative.   Past Medical History:   Past Medical History:  Diagnosis Date  . Alzheimer's dementia   . Frequent headaches   . GERD (gastroesophageal reflux disease)       History reviewed. No pertinent surgical history.  Social History:  Ambulatory independently     reports that she has never smoked. She has never used smokeless tobacco. She reports that she does not drink alcohol or use drugs.     Family History:   Family History  Problem Relation Age of Onset  . Cancer Mother   . Heart disease Mother   . Stroke Mother   . Hypertension Mother   . Heart disease Father   . Stroke Father   . Hypertension Father     Allergies: Allergies  Allergen Reactions  . Penicillins Hives and Other (See Comments)    Has patient had a PCN reaction causing immediate rash, facial/tongue/throat swelling, SOB or lightheadedness with hypotension: Yes Has patient had a PCN reaction causing severe rash involving mucus membranes or skin necrosis: Unk Has patient had a PCN reaction that required hospitalization: No Has patient had a PCN  reaction occurring within the last 10 years: No If all of the above answers are "NO", then may proceed with Cephalosporin use.  Prior to Admission medications   Medication Sig Start Date End Date Taking? Authorizing Provider  acetaminophen (TYLENOL) 325 MG tablet Take 325-650 mg by mouth every 6 (six) hours as needed (for headaches).   Yes [provider]  cetirizine (ZYRTEC) 10 MG tablet Take 10 mg by mouth daily.    Yes [provider]  donepezil (ARICEPT) 10 MG tablet Take 10 mg at bedtime 03/03/18  Yes [place], Betty G, MD  memantine (NAMENDA) 10 MG tablet Take 1 tablet (10 mg total) by mouth 2 (two) times daily. 03/03/18  Yes [place], Betty G, MD  pantoprazole (PROTONIX) 20 MG tablet Take 1 tablet (20 mg total) by mouth daily. 12/05/16  Yes Arby Barrette, MD  ranitidine (ZANTAC) 150 MG tablet Take 150 mg by mouth 2 (two) times daily as needed for heartburn.   Yes [provider]  triamcinolone (NASACORT ALLERGY 24HR) 55 MCG/ACT AERO nasal inhaler Place 2 sprays into the nose daily as needed (for seasonal allergies).    Yes [provider]  Vitamin D, Ergocalciferol, (DRISDOL) 50000 units CAPS capsule Take 1 capsule (50,000 Units total) by mouth once a week. FOR 16 WEEKS AND THEN ONCE A WEEK EVERY 2 WEEKS 01/20/18 03/21/18 Yes [place], Betty G, MD   Physical Exam: Blood pressure 132/62, pulse 97, temperature 97.8 F (36.6 C), resp. rate 20, height 5' (1.524 m), weight 74.8 kg (165 lb), SpO2 98 %. 1. General:  in No Acute distress  well  -appearing 2. Psychological: Alert but not  Oriented, pleasantly confused 3. Head/ENT:   Dry Mucous Membranes                          Head Non traumatic, neck supple                            Poor Dentition 4. SKIN:  decreased Skin turgor,  Skin clean Dry and intact no rash 5. Heart: Regular rate and rhythm no  Murmur, no Rub or gallop 6. Lungs:  Clear to auscultation bilaterally, no wheezes or crackles   7. Abdomen:  Soft, non-tender, Non distended   bowel sounds present 8. Lower extremities: no clubbing, cyanosis, or edema 9. Neurologically Grossly intact, moving all 4 extremities equally not fully cooperative 10. MSK: Normal range of motion   LABS:     Recent Labs  Lab 03/05/18 2045 03/05/18 2107  WBC 7.9  --   HGB 7.2* 7.1*  HCT 22.7* 21.0*  MCV 80.5  --   PLT 199  --    Basic Metabolic Panel: Recent Labs  Lab 03/05/18 2045 03/05/18 2107  NA 140 143  K 3.3* 3.4*  CL 114* 112*  CO2 17*  --   GLUCOSE 103* 101*  BUN 13 13  CREATININE 0.77 0.70  CALCIUM 7.2*  --       No results for input(s): AST, ALT, ALKPHOS, BILITOT, PROT, ALBUMIN in the last 168 hours. No results for input(s): LIPASE, AMYLASE in the last 168 hours. No results for input(s): AMMONIA in the last 168 hours.    HbA1C: No results for input(s): HGBA1C in the last 72 hours. CBG: No results for input(s): GLUCAP in the last 168 hours.    Urine analysis:    Component Value Date/Time   COLORURINE YELLOW 03/05/2018 2055   APPEARANCEUR HAZY (A) 03/05/2018 2055   LABSPEC 1.023 03/05/2018 2055   PHURINE 5.0 03/05/2018 2055  GLUCOSEU NEGATIVE 03/05/2018 2055   HGBUR NEGATIVE 03/05/2018 2055   BILIRUBINUR NEGATIVE 03/05/2018 2055   KETONESUR NEGATIVE 03/05/2018 2055   PROTEINUR 30 (A) 03/05/2018 2055   NITRITE NEGATIVE 03/05/2018 2055   LEUKOCYTESUR NEGATIVE 03/05/2018 2055       Cultures: No results found for: SDES, SPECREQUEST, CULT, REPTSTATUS   Radiological Exams on Admission: Dg Chest 2 View  Result Date: 03/05/2018 CLINICAL DATA:  Transient [facility] at [facility] EXAM: CHEST - 2 VIEW COMPARISON:  Radiograph 11/28/2017 FINDINGS: Persistent low lung volumes. Unchanged heart size and mediastinal contours with mild cardiomegaly. Improved interstitial prominence from prior exam. Possible residual mild vascular congestion. No confluent airspace disease, pleural effusion or pneumothorax. No acute osseous  abnormalities. IMPRESSION: Low lung volumes. Chronic cardiomegaly. Improved pulmonary vasculature from prior with possible mild residual vascular congestion. Electronically Signed   By: Rubye Oaks M.D.   On: 03/05/2018 21:41    Chart has been reviewed    Assessment/Plan   67 y.o. female with medical history significant of advanced dementia  Admitted for syncope worsening anemia  Present on Admission: . Syncope -  given risk factor will admit rehydrate obtain CE, monitor on tele and obtain carotid dopplers, 2D echo  . Hypokalemia we will replace check magnesium and phosphate levels . Anemia - -  cause of Anemia  Unclear  -Obtain anemia panel - Hemoccult stool 3 - Check TSH - If evidence of iron deficiency anemia or Hemoccult positive stools will need further GI workup - Obtain type and screen,  Transfuse for hemoglobin below 7 or patient being significantly symptomatic   . Severe dehydration we will rehydrate patient possibly decreased p.o. intake per family has been very selective of her food order nutritional consult . Alzheimer's dementia with behavioral disturbance standing expect some degree of sundowning while hospitalized given syncopal event of hypotension we will hold Aricept and Namenda for tonight . GERD (gastroesophageal reflux disease) stable continue home medications   Other plan as per orders.  DVT prophylaxis: SCD    Code Status:    DNR/DNI   as per  amily  I had personally discussed CODE STATUS with patient and family     Family Communication:   Family   at  Bedside nefew  Disposition Plan:   likely will need placement for rehabilitation                                             Would benefit from PT/OT eval prior to DC  ordered                                              Consults called: none  Admission status:    obs   Level of care   tele        Therisa Doyne 03/05/2018, 11:33 PM   [facility]  Pager [phone]   after 2 AM  please page floor coverage PA If 7AM-7PM, please contact the day team taking care of the patient  [email or web address]  Password [email or web address]

## 2018-03-05 NOTE — ED Provider Notes (Signed)
[facility] EMERGENCY DEPARTMENT Provider Note   CSN: [id number] Arrival date & time: 03/05/18  2000     History   Chief Complaint Chief Complaint  Patient presents with  . Loss of Consciousness    HPI Emily Curtis is a 67 y.o. female.  67 yo F with a significant past medical history of early onset Alzheimer's comes with a chief complaint of a syncopal event.  She was found to have a blood pressure in the 60s on arrival by EMS.  Improved with some IV fluids.  History from nursing home and EMS is noncontributory.  Patient is nonverbal.  Level 5 caveat.  The patients son showed up with further history.  The patient with her extensive dementia has been at her baseline.  No vomiting or diarrhea she will eat and drink when encouraged though has to be encouraged her otherwise she will not.  She is incontinent of urine and stool.  No dark stools or blood in her stool.  She lives with her son and her sister.  The syncopal event actually took place at a store.  She was standing and suddenly wavered back and forth lost consciousness.  No injury per the family.  The history is provided by the EMS personnel and the nursing home.  Loss of Consciousness   Pertinent negatives include chest pain, congestion, dizziness, fever, headaches, nausea, palpitations and vomiting.    Past Medical History:  Diagnosis Date  . Alzheimer's dementia   . Frequent headaches   . GERD (gastroesophageal reflux disease)     Patient Active Problem List   Diagnosis Date Noted  . Hypokalemia 03/05/2018  . Anemia 03/05/2018  . GERD (gastroesophageal reflux disease) 11/28/2017  . Syncope 11/28/2017  . Fall 11/28/2017  . Severe dehydration 11/28/2017  . Alzheimer's dementia with behavioral disturbance 12/18/2016  . 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 No pertinent surgical history.   OB History   None      Home Medications    Prior to Admission medications     Medication Sig Start Date End Date Taking? Authorizing Provider  acetaminophen (TYLENOL) 325 MG tablet Take 325-650 mg by mouth every 6 (six) hours as needed (for headaches).   Yes [provider]  cetirizine (ZYRTEC) 10 MG tablet Take 10 mg by mouth daily.    Yes [provider]  donepezil (ARICEPT) 10 MG tablet Take 10 mg at bedtime 03/03/18  Yes [place], Betty G, MD  memantine (NAMENDA) 10 MG tablet Take 1 tablet (10 mg total) by mouth 2 (two) times daily. 03/03/18  Yes [place], Betty G, MD  pantoprazole (PROTONIX) 20 MG tablet Take 1 tablet (20 mg total) by mouth daily. 12/05/16  Yes Arby Barrette, MD  ranitidine (ZANTAC) 150 MG tablet Take 150 mg by mouth 2 (two) times daily as needed for heartburn.   Yes [provider]  triamcinolone (NASACORT ALLERGY 24HR) 55 MCG/ACT AERO nasal inhaler Place 2 sprays into the nose daily as needed (for seasonal allergies).    Yes [provider]  Vitamin D, Ergocalciferol, (DRISDOL) 50000 units CAPS capsule Take 1 capsule (50,000 Units total) by mouth once a week. FOR 16 WEEKS AND THEN ONCE A WEEK EVERY 2 WEEKS 01/20/18 03/21/18 Yes [place], Betty G, MD    Family History Family History  Problem Relation Age of Onset  . Cancer Mother   . Heart disease Mother   . Stroke Mother   . Hypertension Mother   .  Heart disease Father   . Stroke Father   . Hypertension Father     Social History Social History   Tobacco Use  . Smoking status: Never Smoker  . Smokeless tobacco: Never Used  Substance Use Topics  . Alcohol use: No  . Drug use: No     Allergies   Penicillins   Review of Systems Review of Systems  Unable to perform ROS: Patient nonverbal  Constitutional: Negative for chills and fever.  HENT: Negative for congestion and rhinorrhea.   Eyes: Negative for redness and visual disturbance.  Respiratory: Negative for shortness of breath and wheezing.   Cardiovascular: Positive for syncope. Negative for chest  pain and palpitations.  Gastrointestinal: Negative for nausea and vomiting.  Genitourinary: Negative for dysuria and urgency.  Musculoskeletal: Negative for arthralgias and myalgias.  Skin: Negative for pallor and wound.  Neurological: Positive for syncope. Negative for dizziness and headaches.     Physical Exam Updated Vital Signs BP 132/62   Pulse 97   Temp 97.8 F (36.6 C)   Resp 20   Ht 5' (1.524 m)   Wt 74.8 kg (165 lb)   SpO2 98%   BMI 32.22 kg/m   Physical Exam  Constitutional: No distress.  Chronically ill appearing  HENT:  Head: Normocephalic and atraumatic.  Eyes: Pupils are equal, round, and reactive to light. EOM are normal.  Neck: Normal range of motion. Neck supple.  Cardiovascular: Normal rate and regular rhythm. Exam reveals no gallop and no friction rub.  No murmur heard. Pulmonary/Chest: Effort normal. She has no wheezes. She has no rales.  Abdominal: Soft. She exhibits no distension. There is no tenderness.  Musculoskeletal: She exhibits no edema or tenderness.  Neurological: She is alert.  Non verbal  Skin: Skin is warm and dry. She is not diaphoretic.  Psychiatric: She has a normal mood and affect. Her behavior is normal.  Nursing note and vitals reviewed.    ED Treatments / Results  Labs (all labs ordered are listed, but only abnormal results are displayed) Labs Reviewed  BASIC METABOLIC PANEL - Abnormal; Notable for the following components:      Result Value   Potassium 3.3 (*)    Chloride 114 (*)    CO2 17 (*)    Glucose, Bld 103 (*)    Calcium 7.2 (*)    All other components within normal limits  CBC - Abnormal; Notable for the following components:   RBC 2.82 (*)    Hemoglobin 7.2 (*)    HCT 22.7 (*)    MCH 25.5 (*)    All other components within normal limits  URINALYSIS, ROUTINE W REFLEX MICROSCOPIC - Abnormal; Notable for the following components:   APPearance HAZY (*)    Protein, ur 30 (*)    Bacteria, UA RARE (*)    All  other components within normal limits  I-STAT CG4 LACTIC ACID, ED - Abnormal; Notable for the following components:   Lactic Acid, Venous 2.14 (*)    All other components within normal limits  I-STAT CHEM 8, ED - Abnormal; Notable for the following components:   Potassium 3.4 (*)    Chloride 112 (*)    Glucose, Bld 101 (*)    Calcium, Ion 1.04 (*)    TCO2 19 (*)    Hemoglobin 7.1 (*)    HCT 21.0 (*)    All other components within normal limits  VITAMIN B12  FOLATE  IRON AND TIBC  FERRITIN  RETICULOCYTES  CBC WITH DIFFERENTIAL/PLATELET  MAGNESIUM  PHOSPHORUS  PREALBUMIN  HEPATIC FUNCTION PANEL  TROPONIN I  TROPONIN I  TROPONIN I  CK  CBG MONITORING, ED  I-STAT CG4 LACTIC ACID, ED  POC OCCULT BLOOD, ED  TYPE AND SCREEN    EKG EKG Interpretation  Date/Time:  Wednesday March 05 2018 20:59:34 EDT Ventricular Rate:  88 PR Interval:    QRS Duration: 75 QT Interval:  382 QTC Calculation: 463 R Axis:   33 Text Interpretation:  Sinus rhythm Low voltage, precordial leads No significant change since last tracing Confirmed by Melene PlanFloyd, Talasia Saulter [phone]([id number]) on 03/05/2018 9:10:40 PM   Radiology Dg Chest 2 View  Result Date: 03/05/2018 CLINICAL DATA:  [facility] EXAM: CHEST - 2 VIEW COMPARISON:  Radiograph 11/28/2017 FINDINGS: Persistent low lung volumes. Unchanged heart size and mediastinal contours with mild cardiomegaly. Improved interstitial prominence from prior exam. Possible residual mild vascular congestion. No confluent airspace disease, pleural effusion or pneumothorax. No acute osseous abnormalities. IMPRESSION: Low lung volumes. Chronic cardiomegaly. Improved pulmonary vasculature from prior with possible mild residual vascular congestion. Electronically Signed   By: Rubye OaksMelanie  Ehinger M.D.   On: 03/05/2018 21:41    Procedures Procedures (including critical care time)  Medications Ordered in ED Medications  sodium chloride 0.9 % bolus 1,000 mL (0 mLs  Intravenous Stopped 03/05/18 2131)     Initial Impression / Assessment and Plan / ED Course  I have reviewed the triage vital signs and the nursing notes.  Pertinent labs & imaging results that were available during my care of the patient were reviewed by me and considered in my medical decision making (see chart for details).     67 yo F with a chief complaint of syncope.  She has found to be hypotensive and bradycardic.  Blood pressure was in the 60s on EMS arrival.  This may have been a vagal event though I am unable to obtain further history.  We will give a bag of fluids check labs and reassess.  The patient is noted to have a 2 g hemoglobin drop since last month.  She also has a hyperchloremic metabolic acidosis.  I suspect that the patient has been profoundly dehydrated and I am unsure why her chloride is so high.  Her rectal exam is negative for occult blood.  She has a macrocytic anemia so this may be iron deficiency.  Will discuss with the hospitalist.  The patients results and plan were reviewed and discussed.   Any x-rays performed were independently reviewed by myself.   Differential diagnosis were considered with the presenting HPI.  Medications  sodium chloride 0.9 % bolus 1,000 mL (0 mLs Intravenous Stopped 03/05/18 2131)    Vitals:   03/05/18 2004 03/05/18 2100 03/05/18 2130  BP: 111/64 113/61 132/62  Pulse: 77  97  Resp: 18 18 20   Temp: 97.8 F (36.6 C)    SpO2: 96% 97% 98%  Weight: 74.8 kg (165 lb)    Height: 5' (1.524 m)      Final diagnoses:  Syncope and collapse  Hypotension due to hypovolemia    Admission/ observation were discussed with the admitting physician, patient and/or family and they are comfortable with the plan.    Final Clinical Impressions(s) / ED Diagnoses   Final diagnoses:  Syncope and collapse  Hypotension due to hypovolemia    ED Discharge Orders    None       Melene PlanFloyd, Shin Lamour, DO 03/05/18 2306

## 2018-03-05 NOTE — ED Notes (Signed)
Nurse currently drawing labs 

## 2018-03-05 NOTE — ED Triage Notes (Signed)
[facility] was at [facility], knees buckled beneath her and she fell to knees. EMS reports on their arrival pt was pale, diaphoretic, BP 64/40. Pt received 500cc NS by EMS, arrives BP 111/64. Pt w/ hx of dementia and unable to answer questions appropriately

## 2018-03-06 ENCOUNTER — Encounter: Payer: Self-pay | Admitting: Nurse Practitioner

## 2018-03-06 ENCOUNTER — Observation Stay (HOSPITAL_COMMUNITY): Payer: Self-pay

## 2018-03-06 ENCOUNTER — Observation Stay (HOSPITAL_BASED_OUTPATIENT_CLINIC_OR_DEPARTMENT_OTHER): Payer: Self-pay

## 2018-03-06 DIAGNOSIS — R55 Syncope and collapse: Secondary | ICD-10-CM

## 2018-03-06 LAB — IRON AND TIBC
Iron: 9 ug/dL — ABNORMAL LOW (ref 28–170)
SATURATION RATIOS: 2 % — AB (ref 10.4–31.8)
TIBC: 365 ug/dL (ref 250–450)
UIBC: 356 ug/dL

## 2018-03-06 LAB — PREPARE RBC (CROSSMATCH)

## 2018-03-06 LAB — COMPREHENSIVE METABOLIC PANEL
ALBUMIN: 3.1 g/dL — AB (ref 3.5–5.0)
ALT: 9 U/L — AB (ref 14–54)
AST: 11 U/L — ABNORMAL LOW (ref 15–41)
Alkaline Phosphatase: 88 U/L (ref 38–126)
Anion gap: 9 (ref 5–15)
BUN: 10 mg/dL (ref 6–20)
CHLORIDE: 111 mmol/L (ref 101–111)
CO2: 20 mmol/L — AB (ref 22–32)
CREATININE: 0.75 mg/dL (ref 0.44–1.00)
Calcium: 8.3 mg/dL — ABNORMAL LOW (ref 8.9–10.3)
GFR calc non Af Amer: >60 mL/min (ref >60)
GLUCOSE: 100 mg/dL — AB (ref 65–99)
Potassium: 3.9 mmol/L (ref 3.5–5.1)
SODIUM: 140 mmol/L (ref 135–145)
Total Bilirubin: 0.4 mg/dL (ref 0.3–1.2)
Total Protein: 5.9 g/dL — ABNORMAL LOW (ref 6.5–8.1)

## 2018-03-06 LAB — CK: Total CK: 38 U/L (ref 38–234)

## 2018-03-06 LAB — PROTIME-INR
INR: 1.12
Prothrombin Time: 14.3 seconds (ref 11.4–15.2)

## 2018-03-06 LAB — GLUCOSE, CAPILLARY: GLUCOSE-CAPILLARY: 85 mg/dL (ref 65–99)

## 2018-03-06 LAB — ECHOCARDIOGRAM COMPLETE
Height: 60 in
Weight: 2640 oz

## 2018-03-06 LAB — MAGNESIUM: MAGNESIUM: 2 mg/dL (ref 1.7–2.4)

## 2018-03-06 LAB — FOLATE: Folate: 4.3 ng/mL — ABNORMAL LOW (ref >5.9)

## 2018-03-06 LAB — TROPONIN I
Troponin I: <0.03 ng/mL (ref <0.03)
Troponin I: <0.03 ng/mL (ref <0.03)

## 2018-03-06 LAB — HIV ANTIBODY (ROUTINE TESTING W REFLEX): HIV SCREEN 4TH GENERATION: NONREACTIVE

## 2018-03-06 LAB — PHOSPHORUS: PHOSPHORUS: 3.9 mg/dL (ref 2.5–4.6)

## 2018-03-06 LAB — VITAMIN B12: VITAMIN B 12: 90 pg/mL — AB (ref 180–914)

## 2018-03-06 LAB — PREALBUMIN: Prealbumin: 14.8 mg/dL — ABNORMAL LOW (ref 18–38)

## 2018-03-06 LAB — FERRITIN: FERRITIN: 6 ng/mL — AB (ref 11–307)

## 2018-03-06 LAB — TSH: TSH: 2.251 u[IU]/mL (ref 0.350–4.500)

## 2018-03-06 MED ORDER — FAMOTIDINE 20 MG PO TABS
10.0000 mg | ORAL_TABLET | Freq: Every day | ORAL | Status: DC
  Administered 2018-03-06 – 2018-03-07 (×2): 10 mg via ORAL
  Filled 2018-03-06 (×2): qty 1

## 2018-03-06 MED ORDER — PANTOPRAZOLE SODIUM 20 MG PO TBEC
20.0000 mg | DELAYED_RELEASE_TABLET | Freq: Every day | ORAL | Status: DC
  Administered 2018-03-06 – 2018-03-07 (×2): 20 mg via ORAL
  Filled 2018-03-06 (×2): qty 1

## 2018-03-06 MED ORDER — FERROUS SULFATE 325 (65 FE) MG PO TABS
325.0000 mg | ORAL_TABLET | Freq: Every day | ORAL | Status: DC
  Administered 2018-03-06 – 2018-03-07 (×2): 325 mg via ORAL
  Filled 2018-03-06 (×2): qty 1

## 2018-03-06 MED ORDER — SODIUM CHLORIDE 0.9 % IV SOLN
INTRAVENOUS | Status: DC
  Administered 2018-03-06 – 2018-03-07 (×3): via INTRAVENOUS

## 2018-03-06 MED ORDER — LORATADINE 10 MG PO TABS
10.0000 mg | ORAL_TABLET | Freq: Every day | ORAL | Status: DC
  Administered 2018-03-06 – 2018-03-07 (×2): 10 mg via ORAL
  Filled 2018-03-06 (×2): qty 1

## 2018-03-06 MED ORDER — SODIUM CHLORIDE 0.9% FLUSH: 3.0000 mL | Freq: Two times a day (BID) | INTRAVENOUS | Status: DC

## 2018-03-06 MED ORDER — FOLIC ACID 1 MG PO TABS
1.0000 mg | ORAL_TABLET | Freq: Every day | ORAL | Status: DC
  Administered 2018-03-06 – 2018-03-07 (×2): 1 mg via ORAL
  Filled 2018-03-06 (×2): qty 1

## 2018-03-06 MED ORDER — SODIUM CHLORIDE 0.9 % IV SOLN
Freq: Once | INTRAVENOUS | Status: AC
  Administered 2018-03-06: 17:00:00 via INTRAVENOUS

## 2018-03-06 MED ORDER — CYANOCOBALAMIN 1000 MCG/ML IJ SOLN
1000.0000 ug | Freq: Once | INTRAMUSCULAR | Status: DC
  Filled 2018-03-06: qty 1

## 2018-03-06 NOTE — ED Notes (Signed)
Brief wet and saturated w/ urine. Bed change provided, pt placed in fresh brief.

## 2018-03-06 NOTE — Progress Notes (Signed)
Pt admitted from ED by stretcher accompanied by a nurse tech, on arrival pt is alerted able to fellow command but cannot keep up with communication due to severe dementia,  Skin is intact, dry, and  has mole on face, vital signs are stable, hooked to tele CMD notified, admission hx not done since pt not in her rightful mind, no family member around to obtain hx, fall assessment done has been encourage to call for help when getting out of bed, bed alarm turned on will continue to monitor

## 2018-03-06 NOTE — ED Notes (Signed)
Pt placed in fresh brief& chux pad. Boosted and repositioned, no other needs apparent at this time.

## 2018-03-06 NOTE — Progress Notes (Signed)
  Echocardiogram 2D Echocardiogram has been performed.  Emily SavoyCasey N Tarea Curtis 03/06/2018, 10:00 AM

## 2018-03-06 NOTE — Progress Notes (Signed)
Patient with admitted with loss of consciousness. Found to have iron deficiency anemia with hgb of 7.7. Getting blood. No overt GI bleeding and heme negative. She should be appropriate for outpatient GI evaluation. I have made her an appt with me and put it in Epic.

## 2018-03-06 NOTE — Progress Notes (Signed)
Carotid duplex prelim: no significant ICA stenosis.  Angelisse Riso Eunice, RDMS, RVT   

## 2018-03-06 NOTE — Evaluation (Signed)
Physical Therapy Evaluation Patient Details Name: Emily Curtis MRN: [id number] DOB: February 23, 1951 Today's Date: 03/06/2018   History of Present Illness  67 y.o. female with medical history significant of advanced dementia Presented with  Episode of syncope happened at the store she was standing in line at pharmacy. Denies head injury. She set down on the floor. The family tried to get her up to the chair she lost conscience no seizure activity, no loss of urine. vomited after regained conscience. Admitted for syncope worsening anemia  Clinical Impression  Pt non-verbal and no family present during evaluation. Per review of notes pt is able to ambulate without assistive device and family assists with bathing, dressing, and iADLs. Pt is currently, min guard for bed mobility, transfers and minA for ambulation of 16 feet to manage RW as pt let from it half way through ambulation. Pt limited by decreased balance and safety awareness. Pt will require 24 hr supervision at d/c either from family or in SNF level rehab. PT will continue to follow acutely.    Follow Up Recommendations SNF;Supervision/Assistance - 24 hour    Equipment Recommendations  Other (comment)(TBD)    Recommendations for Other Services       Precautions / Restrictions Precautions Precautions: Fall Restrictions Weight Bearing Restrictions: No      Mobility  Bed Mobility Overal bed mobility: Modified Independent             General bed mobility comments: HoB elevated, able to come to EoB with visual and verbal cues,   Transfers Overall transfer level: Needs assistance Equipment used: Rolling walker (2 wheeled) Transfers: Sit to/from Stand Sit to Stand: Min guard         General transfer comment: pt nodded when shown RW so used for transfers, min guard with sit>stand, increased vocalization with standing, did not respond to questioning on dizziness, while checking tele box pt sat backdown, HR was 85 bpm, min  guard sit>stand again   Ambulation/Gait Ambulation/Gait assistance: Min assist Ambulation Distance (Feet): 16 Feet Assistive device: Rolling walker (2 wheeled) Gait Pattern/deviations: Step-through pattern;Shuffle;Trunk flexed Gait velocity: slowed Gait velocity interpretation: Below normal speed for age/gender General Gait Details: min assist for management of RW with ambulation toward door, half way turned loose of the RW and required redirect back to RW for ambulation and visual cues to turn back to recliner       Balance Overall balance assessment: Needs assistance Sitting-balance support: Feet supported Sitting balance-Leahy Scale: Fair     Standing balance support: Bilateral upper extremity supported Standing balance-Leahy Scale: Fair Standing balance comment: requires RW support for balance                             Pertinent Vitals/Pain Pain Assessment: Faces Faces Pain Scale: No hurt    Home Living Family/patient expects to be discharged to:: Unsure                      Prior Function Level of Independence: Needs assistance         Comments: Per previous note, she is able to ambulate without assistive device at baseline but she requires assistance with eating, toileting, dressing and bathing.      Hand Dominance        Extremity/Trunk Assessment   Upper Extremity Assessment Upper Extremity Assessment: Overall WFL for tasks assessed    Lower Extremity Assessment Lower Extremity Assessment: Overall WFL for  tasks assessed       Communication   Communication: Expressive difficulties;Receptive difficulties  Cognition Arousal/Alertness: Awake/alert Behavior During Therapy: Anxious Overall Cognitive Status: History of cognitive impairments - at baseline                                 General Comments: pt nonverbal and says "ahhh" with all questioning and interaction, occasional nodding appropriately to questions.  slightly anxious with increase in HR with ambulation,       General Comments General comments (skin integrity, edema, etc.): at rest HR 63 bpm, with sitting HR increased to 85 bpm, with ambulation incresed to 112 bpm, pt appeared anxious with mobility.      Assessment/Plan    PT Assessment Patient needs continued PT services  PT Problem List Decreased activity tolerance;Decreased mobility;Decreased safety awareness;Cardiopulmonary status limiting activity       PT Treatment Interventions Gait training;DME instruction;Functional mobility training;Therapeutic activities;Therapeutic exercise;Balance training;Cognitive remediation;Patient/family education    PT Goals (Current goals can be found in the Care Plan section)  Acute Rehab PT Goals Patient Stated Goal: none stated PT Goal Formulation: Patient unable to participate in goal setting Time For Goal Achievement: 03/20/18 Potential to Achieve Goals: Fair    Frequency Min 2X/week    AM-PAC PT "6 Clicks" Daily Activity  Outcome Measure Difficulty turning over in bed (including adjusting bedclothes, sheets and blankets)?: A Little Difficulty moving from lying on back to sitting on the side of the bed? : Unable Difficulty sitting down on and standing up from a chair with arms (e.g., wheelchair, bedside commode, etc,.)?: Unable Help needed moving to and from a bed to chair (including a wheelchair)?: A Little Help needed walking in hospital room?: A Little Help needed climbing 3-5 steps with a railing? : A Lot 6 Click Score: 13    End of Session Equipment Utilized During Treatment: Gait belt Activity Tolerance: Patient tolerated treatment well Patient left: in chair;with call bell/phone within reach;with chair alarm set Nurse Communication: Mobility status PT Visit Diagnosis: Unsteadiness on feet (R26.81);Other abnormalities of gait and mobility (R26.89);Muscle weakness (generalized) (M62.81);Difficulty in walking, not elsewhere  classified (R26.2)    Time: [id number] PT Time Calculation (min) (ACUTE ONLY): 27 min   Charges:   PT Evaluation $PT Eval Moderate Complexity: 1 Mod PT Treatments $Therapeutic Activity: 8-22 mins   PT G Codes:        Kermitt Harjo B. Beverely RisenVan Fleet PT, DPT Acute Rehabilitation  [phone] Pager [phone]    Elon Alaslizabeth B Van Fleet 03/06/2018, 12:53 PM

## 2018-03-06 NOTE — ED Notes (Signed)
Report to Lakeisha RN

## 2018-03-06 NOTE — Progress Notes (Signed)
Nutrition Brief Note  Patient identified on the Malnutrition Screening Tool (MST) Report  Wt Readings from Last 15 Encounters:  03/05/18 165 lb (74.8 kg)  01/25/18 165 lb (74.8 kg)  01/13/18 165 lb (74.8 kg)  11/29/17 188 lb 11.4 oz (85.6 kg)  01/18/17 192 lb 2 oz (87.1 kg)  12/18/16 186 lb 8 oz (84.6 kg)  12/04/16 200 lb (90.7 kg)   67 y.o. female with medical history significant of advanced dementia  Admitted for syncope and worsening anemia  Attempted to speak with patient at bedside but she is unable to provide any history. Nephew was at bedside during 2nd visit. He is unsure of her weight but denies any noticeable changes (patient lives with him and his mother). PO intake is good as long as patient receives what she wants such as grilled chicken sandwiches or chicken nuggets from [facility]. Also will eat fruit.  No other history available. Physical exam is ok, appears WNL. Per chart she exhibits weight loss but unsure of accuracy. Also last 3 weights appear stated.  Labs reviewed Medications reviewed and include:  Folic acid, 40K+, NS at 3275mL/hr  Body mass index is 32.22 kg/m. Patient meets criteria for obese class I based on current BMI.   Current diet order is regular, patient is consuming approximately 100% of meals at this time.  No nutrition interventions warranted at this time. If nutrition issues arise, please consult RD.   Dionne AnoWilliam M. Stephenie Navejas, MS, RD LDN Inpatient Clinical Dietitian Pager [phone]

## 2018-03-06 NOTE — ED Notes (Signed)
Pt noted to be saturated w/ urine. Brief/bedding exchanged, pt boosted and repositioned.

## 2018-03-06 NOTE — Progress Notes (Addendum)
PROGRESS NOTE    Emily GreenhouseCathy Lynch Curtis  WUJ:[id number] DOB: 11/25/1951 DOA: 03/05/2018 PCP: [name], Betty G, MD   Brief Narrative: Patient is a 67 year old female with past medical history of advanced dementia who presented to the emergency department after she had an episode of syncope.  As per the family, she was standing in line at pharmacy and she fell on the ground.  There was no report of any injuries.  There was no report of any seizure-like activity, bowel or bladder incontinence.  Patient was found to be hypotensive in the range of 60s on presentation.  Her blood pressure improved after she was given fluids .  Patient was also found to have significant iron deficiency anemia.  Assessment & Plan:   Active Problems:   Alzheimer's dementia with behavioral disturbance   GERD (gastroesophageal reflux disease)   Syncope   Severe dehydration   Hypokalemia   Anemia  Syncope: Likely vasovagal.  Could also be from anemia. Syncopal pathway.  Will obtain carotid Doppler and echocardiogram.  PT/OT consult.  We will check her orthostatic vitals.  Anemia: Her hemoglobin was in the range of 9-10 about 1-2 months ago.  Presented with hemoglobin of 7.1.  FOBT negative.  No report of GI bleed.  Will transfuse her with 1 unit of PRBC today.  Iron studies shows iron deficiency anemia with low iron and ferritin and increase TIBC.  Start on iron supplementation. Also found to be deficient in folate and vitamin B12.  Supplemented.  As per the family,patient has been very selective of her food.Prealblbumin is low. Anemia and other deficiencies could be from suboptimal oral intake or could be also from malabsorption.  I have discussed with GI today  for possible endoscopy/colonoscopy. GI  would like to see her as an outpatient.  Appointment will be set up with [facility]. Will check CBC tomorrow.  Folate/vitamin B12 deficiency:Supplemented.  Will continue supplementation  Hypokalemia:  Supplemented.  Alzheimer's dementia with behavioral disturbances: As per the family, she is able to ambulate without assistive device at baseline but she requires assistance with eating, toileting, dressing and bathing.  She has been nonverbal and has been like that for more than a year.  Her dementia was first diagnosed in 2016 and was termed as Alzheimer's.  GERD: Continue PPI.  Lactic acidosis: Resolved   DVT prophylaxis: SCD Code Status: DNR Family Communication: Call sister on phone, phone not picked up Disposition Plan: Needs to be determined   Consultants: None  Procedures: None  Antimicrobials: None  Subjective:  Patient seen and examined the bedside this morning.  She looks very comfortable.  She is nonverbal on baseline.  Answers every question with "[place]".  She is alert but not oriented. Objective: Vitals:   03/06/18 0400 03/06/18 0500 03/06/18 0610 03/06/18 0838  BP: 113/67 111/66 (!) 111/22 114/65  Pulse:  84 78 69  Resp: 10 14 18 20   Temp:   98.3 F (36.8 C) 99 F (37.2 C)  TempSrc:   Oral Oral  SpO2:  99% 99% 98%  Weight:      Height:        Intake/Output Summary (Last 24 hours) at 03/06/2018 1116 Last data filed at 03/05/2018 2131 Gross per 24 hour  Intake 1000.01 ml  Output -  Net 1000.01 ml   Filed Weights   03/05/18 2004  Weight: 74.8 kg (165 lb)    Examination:  General exam: Appears calm and comfortable ,Not in distress,obese HEENT:PERRL,Oral mucosa moist, Ear/Nose  normal on gross exam Respiratory system: Bilateral equal air entry, normal vesicular breath sounds, no wheezes or crackles  Cardiovascular system: S1 & S2 heard, RRR. No JVD, murmurs, rubs, gallops or clicks. No pedal edema. Gastrointestinal system: Abdomen is nondistended, soft and nontender. No organomegaly or masses felt. Normal bowel sounds heard. Central nervous system: Alert but not  oriented. No focal neurological deficits. Extremities: No edema, no clubbing ,no  cyanosis, distal peripheral pulses palpable. Skin: No rashes, lesions or ulcers,no icterus ,no pallor MSK: Normal muscle bulk,tone ,power Psychiatry: Judgement and insight impaired   Data Reviewed: I have personally reviewed following labs and imaging studies  CBC: Recent Labs  Lab 03/05/18 2045 03/05/18 2107 03/05/18 2248  WBC 7.9  --  9.8  NEUTROABS  --   --  7.8*  HGB 7.2* 7.1* 7.7*  HCT 22.7* 21.0* 24.1*  MCV 80.5  --  79.5  PLT 199  --  225   Basic Metabolic Panel: Recent Labs  Lab 03/05/18 2045 03/05/18 2107 03/05/18 2248 03/06/18 0622  NA 140 143  --  140  K 3.3* 3.4*  --  3.9  CL 114* 112*  --  111  CO2 17*  --   --  20*  GLUCOSE 103* 101*  --  100*  BUN 13 13  --  10  CREATININE 0.77 0.70  --  0.75  CALCIUM 7.2*  --   --  8.3*  MG  --   --  2.0  --   PHOS  --   --  3.9  --    GFR: Estimated Creatinine Clearance: 64.2 mL/min (by C-G formula based on SCr of 0.75 mg/dL). Liver Function Tests: Recent Labs  Lab 03/05/18 2248 03/06/18 0622  AST 13* 11*  ALT 9* 9*  ALKPHOS 83 88  BILITOT 0.2* 0.4  PROT 5.6* 5.9*  ALBUMIN 3.0* 3.1*   No results for input(s): LIPASE, AMYLASE in the last 168 hours. No results for input(s): AMMONIA in the last 168 hours. Coagulation Profile: Recent Labs  Lab 03/06/18 0622  INR 1.12   Cardiac Enzymes: Recent Labs  Lab 03/05/18 2341 03/06/18 0622  CKTOTAL 38  --   TROPONINI <0.03 <0.03   BNP (last 3 results) No results for input(s): PROBNP in the last 8760 hours. HbA1C: No results for input(s): HGBA1C in the last 72 hours. CBG: Recent Labs  Lab 03/06/18 0815  GLUCAP 85   Lipid Profile: No results for input(s): CHOL, HDL, LDLCALC, TRIG, CHOLHDL, LDLDIRECT in the last 72 hours. Thyroid Function Tests: Recent Labs    03/06/18 0622  TSH 2.251   Anemia Panel: Recent Labs    03/05/18 2248  VITAMINB12 90*  FOLATE 4.3*  FERRITIN 6*  TIBC 365  IRON 9*  RETICCTPCT 1.6   Sepsis Labs: Recent Labs   Lab 03/05/18 2108 03/05/18 2257  LATICACIDVEN 2.14* 1.33    No results found for this or any previous visit (from the past 240 hour(s)).       Radiology Studies: Dg Chest 2 View  Result Date: 03/05/2018 CLINICAL DATA:  Transient hypotension while at [facility]. EXAM: CHEST - 2 VIEW COMPARISON:  Radiograph 11/28/2017 FINDINGS: Persistent low lung volumes. Unchanged heart size and mediastinal contours with mild cardiomegaly. Improved interstitial prominence from prior exam. Possible residual mild vascular congestion. No confluent airspace disease, pleural effusion or pneumothorax. No acute osseous abnormalities. IMPRESSION: Low lung volumes. Chronic cardiomegaly. Improved pulmonary vasculature from prior with possible mild residual vascular congestion. Electronically Signed  By: Rubye Oaks M.D.   On: 03/05/2018 21:41        Scheduled Meds: . cyanocobalamin  1,000 mcg Intramuscular Once  . famotidine  10 mg Oral Daily  . ferrous sulfate  325 mg Oral Q breakfast  . folic acid  1 mg Oral Daily  . loratadine  10 mg Oral Daily  . pantoprazole  20 mg Oral Daily  . potassium chloride  40 mEq Oral Once  . sodium chloride flush  3 mL Intravenous Q12H   Continuous Infusions: . sodium chloride 75 mL/hr at 03/06/18 0604     LOS: 0 days    Time spent:25 mins.      Burnadette Pop, MD [facility] Pager [phone]  If 7PM-7AM, please contact night-coverage [email or web address] Password [facility] 03/06/2018, 11:16 AM

## 2018-03-07 DIAGNOSIS — R55 Syncope and collapse: Principal | ICD-10-CM

## 2018-03-07 DIAGNOSIS — D649 Anemia, unspecified: Secondary | ICD-10-CM

## 2018-03-07 LAB — CBC WITH DIFFERENTIAL/PLATELET
Basophils Absolute: 0 10*3/uL (ref 0.0–0.1)
Basophils Relative: 0 %
Eosinophils Absolute: 0.1 10*3/uL (ref 0.0–0.7)
Eosinophils Relative: 1 %
HCT: 29.7 % — ABNORMAL LOW (ref 36.0–46.0)
Hemoglobin: 9.6 g/dL — ABNORMAL LOW (ref 12.0–15.0)
Lymphocytes Relative: 23 %
Lymphs Abs: 1.9 10*3/uL (ref 0.7–4.0)
MCH: 25.8 pg — ABNORMAL LOW (ref 26.0–34.0)
MCHC: 32.3 g/dL (ref 30.0–36.0)
MCV: 79.8 fL (ref 78.0–100.0)
Monocytes Absolute: 0.3 10*3/uL (ref 0.1–1.0)
Monocytes Relative: 4 %
Neutro Abs: 5.8 10*3/uL (ref 1.7–7.7)
Neutrophils Relative %: 72 %
Platelets: 232 10*3/uL (ref 150–400)
RBC: 3.72 MIL/uL — ABNORMAL LOW (ref 3.87–5.11)
RDW: 14.6 % (ref 11.5–15.5)
WBC: 8.2 10*3/uL (ref 4.0–10.5)

## 2018-03-07 LAB — TYPE AND SCREEN
ABO/RH(D): O POS
ANTIBODY SCREEN: NEGATIVE
Unit division: 0

## 2018-03-07 LAB — BPAM RBC
Blood Product Expiration Date: 201904302359
ISSUE DATE / TIME: 201904111706
Unit Type and Rh: 5100

## 2018-03-07 LAB — GLUCOSE, CAPILLARY: Glucose-Capillary: 98 mg/dL (ref 65–99)

## 2018-03-07 MED ORDER — CYANOCOBALAMIN 1000 MCG/ML IJ SOLN
1000.0000 ug | Freq: Once | INTRAMUSCULAR | Status: AC
  Administered 2018-03-07: 1000 ug via INTRAMUSCULAR
  Filled 2018-03-07: qty 1

## 2018-03-07 MED ORDER — FOLIC ACID 1 MG PO TABS: 1.0000 mg | ORAL_TABLET | Freq: Every day | ORAL | 0 refills | Status: AC

## 2018-03-07 MED ORDER — FERROUS SULFATE 325 (65 FE) MG PO TABS: 325.0000 mg | ORAL_TABLET | Freq: Every day | ORAL | 0 refills | Status: AC

## 2018-03-07 MED ORDER — VITAMIN B-12 1000 MCG PO TABS: 1000.0000 ug | ORAL_TABLET | Freq: Every day | ORAL | 0 refills | Status: AC

## 2018-03-07 NOTE — Progress Notes (Addendum)
CSW received consult regarding SNF placement. CSW staffed case with CSW ChiropodistAssistant Director. At this time, patient does not qualify for an LOG and family would be required to pay privately for SNF for long term care. RNCM and patient's sister aware of discharge home today.   CSW signing off.   Osborne Cascoadia Ned Kakar LCSW [phone]

## 2018-03-07 NOTE — Discharge Summary (Signed)
Physician Discharge Summary  Emily Curtis ZOX:[id number] DOB: 1951-09-27 DOA: 03/05/2018  PCP: [place], Betty G, MD  Admit date: 03/05/2018 Discharge date: 03/07/2018  Admitted From: Home Disposition:  Home  Home Health:Yes  Discharge Condition:Stable CODE STATUS:Full Diet recommendation:  Regular  Brief/Interim Summary: Patient is a 67 year old female with past medical history of advanced dementia who presented to the emergency department after she had an episode of syncope.  As per the family, she was standing in line at pharmacy and she fell on the ground.  There was no report of any injuries.  There was no report of any seizure-like activity, bowel or bladder incontinence.  Patient was found to be hypotensive in the range of 60s on presentation.  Her blood pressure improved after she was given fluids .  Patient was admitted for further evaluation.  She did not have any further episodes of syncope after admission.  Her echocardiogram showed normal left ventricular ejection fraction and other parameters.Carotid Doppler did not show any significant obstruction. PT evaluated the patient and recommended skilled nursing facility on discharge but it was not possible because she is uninsured. Patient was also found to have significant iron deficiency anemia as per the iron studies.  She was started on iron supplementation.  Folic acid and vitamin B12 levels were also found to be low.  Started on supplementation. Her anemia was most likely nutritional due to poor oral intake.  She did not have any problems with swallowing.  Her poor intake was most likely associated with her advanced dementia. I discussed with gastroenterology regarding her iron deficiency anemia for possible occult GI bleed.Her FOBT was negative.  After the discussion, it was decided that she will follow-up with gastroenterology as an outpatient.  She will also follow-up with hematology as an outpatient for the management of her  anemia with vitamin B12 deficiency. Patient was also transfused with 1 unit of PRBC during her hospital stay with improvement in her hemoglobin. Patient is stable for discharge to home today.  Following problems were addressed during her hospitalization:  Syncope: Likely vasovagal.  Could also be associated with anemia. Carotid Doppler and echocardiogram normal..  PT/OT consulted and recommended SNF but she is uninsured.  Anemia: Her hemoglobin was in the range of 9-10 about 1-2 months ago.  Presented with hemoglobin of 7.1.  FOBT negative.  No report of GI bleed.  Transfused her with 1 unit of PRBC today.  Iron studies shows iron deficiency anemia with low iron and ferritin and increased TIBC.  Started on iron supplementation. Also found to be deficient in folate and vitamin B12. Supplemented.  As per the family,patient has been very selective of her food.Prealblbumin is low. Anemia and other deficiencies could be from suboptimal oral intake or could be also from malabsorption.  I have discussed with GI today for possible endoscopy/colonoscopy. GI  would like to see her as an outpatient.  Appointment will be set up with [facility]. CBC recommended to be checked in a week.  Folate/vitamin B12 deficiency:Supplemented with IM Vitamin B12.  Will continue Vitamin B12,folic acid supplementation as outpatient.  Hypokalemia: Supplemented.  Alzheimer's dementia with behavioral disturbances: As per the family, she is able to ambulate without assistive device at baseline but she requires assistance with eating, toileting, dressing and bathing.  She has been nonverbal and has been like that for more than a year.  Her dementia was first diagnosed in 2016 and was termed as Alzheimer's.  GERD: Continue PPI.  Lactic  acidosis: Resolved    Discharge Diagnoses:  Principal Problem:   Syncope Active Problems:   Alzheimer's dementia with behavioral disturbance   GERD (gastroesophageal reflux  disease)   Severe dehydration   Hypokalemia   Anemia    Discharge Instructions  Discharge Instructions    Diet - low sodium heart healthy   Complete by:  As directed    Discharge instructions   Complete by:  As directed    1) Take prescribed medications as instructed. 2) Follow up with your PCP in a week.  Do CBC and BMP test during the follow-up. 3) Follow up with gastroenterology in hematology as an outpatient.  Name and number of the provider have been attached. 4) Check Folic acid and Vitamin B12 levels in 1 month.   Increase activity slowly   Complete by:  As directed      Allergies as of 03/07/2018      Reactions   Penicillins Hives, Other (See Comments)   Has patient had a PCN reaction causing immediate rash, facial/tongue/throat swelling, SOB or lightheadedness with hypotension: Yes Has patient had a PCN reaction causing severe rash involving mucus membranes or skin necrosis: Unk Has patient had a PCN reaction that required hospitalization: No Has patient had a PCN reaction occurring within the last 10 years: No If all of the above answers are "NO", then may proceed with Cephalosporin use.      Medication List    TAKE these medications   acetaminophen 325 MG tablet Commonly known as:  TYLENOL Take 325-650 mg by mouth every 6 (six) hours as needed (for headaches).   cetirizine 10 MG tablet Commonly known as:  ZYRTEC Take 10 mg by mouth daily.   donepezil 10 MG tablet Commonly known as:  ARICEPT Take 10 mg at bedtime   ferrous sulfate 325 (65 FE) MG tablet Take 1 tablet (325 mg total) by mouth daily with breakfast. Start taking on:  03/08/2018   folic acid 1 MG tablet Commonly known as:  FOLVITE Take 1 tablet (1 mg total) by mouth daily. Start taking on:  03/08/2018   memantine tablet pack Commonly known as:  NAMENDA TITRATION PACK Take 5 mg by mouth once a day for 1 week then 5 mg two times a day for 1 week then 15 mg daily given in 5 mg & 10 mg separated  doses for 1 week then 10 mg two times a day   memantine 10 MG tablet Commonly known as:  NAMENDA Take 1 tablet (10 mg total) by mouth 2 (two) times daily.   NASACORT ALLERGY 24HR 55 MCG/ACT Aero nasal inhaler Generic drug:  triamcinolone Place 2 sprays into the nose daily as needed (for seasonal allergies).   pantoprazole 20 MG tablet Commonly known as:  PROTONIX Take 1 tablet (20 mg total) by mouth daily.   ranitidine 150 MG tablet Commonly known as:  ZANTAC Take 150 mg by mouth 2 (two) times daily as needed for heartburn.   vitamin B-12 1000 MCG tablet Commonly known as:  CYANOCOBALAMIN Take 1 tablet (1,000 mcg total) by mouth daily.   Vitamin D (Ergocalciferol) 50000 units Caps capsule Commonly known as:  DRISDOL Take 1 capsule (50,000 Units total) by mouth once a week. FOR 16 WEEKS AND THEN ONCE A WEEK EVERY 2 WEEKS      Follow-up Information    Benjiman Core, MD. Schedule an appointment as soon as possible for a visit in 2 week(s).   Specialty:  Oncology Contact  information: [address] [place] [address] [phone]        [place], Betty G, MD. Schedule an appointment as soon as possible for a visit on 03/12/2018.   Specialty:  Family Medicine Why:  11:45 am Contact information: [address] [place] [address] [phone]        Meredith Pel, NP. Schedule an appointment as soon as possible for a visit in 2 week(s).   Specialty:  Gastroenterology Contact information: [address] [place] [address] [phone]          Allergies  Allergen Reactions  . Penicillins Hives and Other (See Comments)    Has patient had a PCN reaction causing immediate rash, facial/tongue/throat swelling, SOB or lightheadedness with hypotension: Yes Has patient had a PCN reaction causing severe rash involving mucus membranes or skin necrosis: Unk Has patient had a PCN reaction that required hospitalization: No Has patient had a  PCN reaction occurring within the last 10 years: No If all of the above answers are "NO", then may proceed with Cephalosporin use.     Consultations:  None   Procedures/Studies: Dg Chest 2 View  Result Date: 03/05/2018 CLINICAL DATA:  Transient hypotension while at [facility]. EXAM: CHEST - 2 VIEW COMPARISON:  Radiograph 11/28/2017 FINDINGS: Persistent low lung volumes. Unchanged heart size and mediastinal contours with mild cardiomegaly. Improved interstitial prominence from prior exam. Possible residual mild vascular congestion. No confluent airspace disease, pleural effusion or pneumothorax. No acute osseous abnormalities. IMPRESSION: Low lung volumes. Chronic cardiomegaly. Improved pulmonary vasculature from prior with possible mild residual vascular congestion. Electronically Signed   By: Rubye Oaks M.D.   On: 03/05/2018 21:41    (Echo, Carotid, EGD, Colonoscopy, ERCP)    Subjective: Patient seen and examined the bedside this morning.  Remains comfortable.  No new issues/events.  Stable for discharge to home.  Discharge Exam: Vitals:   03/07/18 0450 03/07/18 0800  BP: (!) 155/90 140/76  Pulse: 71 71  Resp: 18   Temp:  98.4 F (36.9 C)  SpO2: 99% 98%   Vitals:   03/07/18 0018 03/07/18 0450 03/07/18 0500 03/07/18 0800  BP: 122/72 (!) 155/90  140/76  Pulse: 78 71  71  Resp: 18 18    Temp: 98.3 F (36.8 C)   98.4 F (36.9 C)  TempSrc: Oral   Oral  SpO2: 99% 99%  98%  Weight:   72.4 kg (159 lb 9.8 oz)   Height:        General: Pt is alert, awake, not in acute distress,nonverbal Cardiovascular: RRR, S1/S2 +, no rubs, no gallops Respiratory: CTA bilaterally, no wheezing, no rhonchi Abdominal: Soft, NT, ND, bowel sounds + Extremities: no edema, no cyanosis    The results of significant diagnostics from this hospitalization (including imaging, microbiology, ancillary and laboratory) are listed below for reference.     Microbiology: No results found for this or any  previous visit (from the past 240 hour(s)).   Labs: BNP (last 3 results) No results for input(s): BNP in the last 8760 hours. Basic Metabolic Panel: Recent Labs  Lab 03/05/18 2045 03/05/18 2107 03/05/18 2248 03/06/18 0622  NA 140 143  --  140  K 3.3* 3.4*  --  3.9  CL 114* 112*  --  111  CO2 17*  --   --  20*  GLUCOSE 103* 101*  --  100*  BUN 13 13  --  10  CREATININE 0.77 0.70  --  0.75  CALCIUM 7.2*  --   --  8.3*  MG  --   --  2.0  --   PHOS  --   --  3.9  --    Liver Function Tests: Recent Labs  Lab 03/05/18 2248 03/06/18 0622  AST 13* 11*  ALT 9* 9*  ALKPHOS 83 88  BILITOT 0.2* 0.4  PROT 5.6* 5.9*  ALBUMIN 3.0* 3.1*   No results for input(s): LIPASE, AMYLASE in the last 168 hours. No results for input(s): AMMONIA in the last 168 hours. CBC: Recent Labs  Lab 03/05/18 2045 03/05/18 2107 03/05/18 2248 03/07/18 0657  WBC 7.9  --  9.8 8.2  NEUTROABS  --   --  7.8* 5.8  HGB 7.2* 7.1* 7.7* 9.6*  HCT 22.7* 21.0* 24.1* 29.7*  MCV 80.5  --  79.5 79.8  PLT 199  --  225 232   Cardiac Enzymes: Recent Labs  Lab 03/05/18 2341 03/06/18 0622  CKTOTAL 38  --   TROPONINI <0.03 <0.03   BNP: Invalid input(s): POCBNP CBG: Recent Labs  Lab 03/06/18 0815 03/07/18 0800  GLUCAP 85 98   D-Dimer No results for input(s): DDIMER in the last 72 hours. Hgb A1c No results for input(s): HGBA1C in the last 72 hours. Lipid Profile No results for input(s): CHOL, HDL, LDLCALC, TRIG, CHOLHDL, LDLDIRECT in the last 72 hours. Thyroid function studies Recent Labs    03/06/18 0622  TSH 2.251   Anemia work up Recent Labs    03/05/18 2248  VITAMINB12 90*  FOLATE 4.3*  FERRITIN 6*  TIBC 365  IRON 9*  RETICCTPCT 1.6   Urinalysis    Component Value Date/Time   COLORURINE YELLOW 03/05/2018 2055   APPEARANCEUR HAZY (A) 03/05/2018 2055   LABSPEC 1.023 03/05/2018 2055   PHURINE 5.0 03/05/2018 2055   GLUCOSEU NEGATIVE 03/05/2018 2055   HGBUR NEGATIVE 03/05/2018 2055    BILIRUBINUR NEGATIVE 03/05/2018 2055   KETONESUR NEGATIVE 03/05/2018 2055   PROTEINUR 30 (A) 03/05/2018 2055   NITRITE NEGATIVE 03/05/2018 2055   LEUKOCYTESUR NEGATIVE 03/05/2018 2055   Sepsis Labs Invalid input(s): PROCALCITONIN,  WBC,  LACTICIDVEN Microbiology No results found for this or any previous visit (from the past 240 hour(s)).   Time coordinating discharge: 35 minutes  SIGNED:   Burnadette PopAmrit Hawkins Seaman, MD  [facility] 03/07/2018, 12:02 PM Pager [phone]  If 7PM-7AM, please contact night-coverage [email or web address] Password [email or web address]

## 2018-03-07 NOTE — Progress Notes (Signed)
Emily Greenhouseathy Lynch Mcdonell to be D/C'd Home per MD order.  Discussed with the patient and all questions fully answered.  VSS, Skin clean, dry and intact without evidence of skin break down, no evidence of skin tears noted. IV catheter discontinued intact. Site without signs and symptoms of complications. Dressing and pressure applied.  An After Visit Summary was printed and given to the patient. Patient received prescription.  D/c education completed with patient/family including follow up instructions, medication list, d/c activities limitations if indicated, with other d/c instructions as indicated by MD - patient able to verbalize understanding, all questions fully answered.   Patient instructed to return to ED, call 911, or call MD for any changes in condition.   Patient escorted via WC, and D/C home via private auto.  [name] 03/07/2018 2:53 PM

## 2018-03-07 NOTE — Care Management Note (Addendum)
Case Management Note  Patient Details  Name: Emily Curtis MRN: [id number] Date of Birth: 04/24/1951  Subjective/Objective:          Admitted for syncope worsening anemia. Hx of advanced dementia.      Emily GreenMartha Curtis (Sister) Emily Curtis([phone])    [id number] [phone]       03/07/2018 @ 12:58 [facility] made NCM aware pt did not qualify for charity case for home health services after obtaining financial assessment from sister.[facility] stated pt can privately pay for services (RN-$185.00/day and PT - $ 195.00/day) which NCM  called and shared with pt's sister, Emily BridgeMartha stated she understands however she can't afford to privately pay for home health services and she's hoping to pt's disability approved this week and will f/u with PCP with obtaining home health services.  PCP: Emily Curtis  Action/Plan: Transition to home with home health services (RNPT,SW) [facility]  Pt to d/c home with family. Sister Emily Curtis(Emily) states 24/7supervision will be provided for pt @ d/c.  Family to provide transportation to home.   Expected Discharge Date:    03/07/2018           Expected Discharge Plan:  Home w Home Health Services(Resides with sister Emily BridgeMartha)  In-House Referral:  Clinical Social Work  Discharge planning Services  CM Consult  Post Acute Care Choice:    Choice offered to:  Sibling  DME Arranged:    DME Agency:     HH Arranged:  RN, PT, Social Work HH Agency:  [facility],charity case denied.   Status of Service:  Completed, signed off  If discussed at Long Length of Stay Meetings, dates discussed:    Additional Comments:  Emily Curtis, Emily Lenoir Hudson, RN 03/07/2018, 11:22 AM

## 2018-03-07 NOTE — Evaluation (Signed)
Occupational Therapy Evaluation Patient Details Name: Emily CaffeyCathy Lynch Lohmann MRN: [id number] DOB: 12/04/1950 Today's Date: 03/07/2018    History of Present Illness 67 y.o. female with medical history significant of advanced dementia Presented with  Episode of syncope happened at the store she was standing in line at pharmacy. Denies head injury. She set down on the floor. The family tried to get her up to the chair she lost conscience no seizure activity, no loss of urine. vomited after regained conscience. Admitted for syncope worsening anemia   Clinical Impression   Due to hx of advanced dementia, pt requires physical cues/prompts to initiate and complete tasks. Pt non verbal with giggles/laughs for responses. Pt requires assist with all ADLs and min guard A with mobility. PLOF uncertain, however chart states that pt had assist with all ADLs PTA. No further acute OT indicated at this time, defer any further OT intervention to SNF    Follow Up Recommendations  SNF;Supervision/Assistance - 24 hour    Equipment Recommendations  None recommended by OT;Other (comment)    Recommendations for Other Services       Precautions / Restrictions Precautions Precautions: Fall Precaution Comments: hx dementia Restrictions Weight Bearing Restrictions: No      Mobility Bed Mobility Overal bed mobility: Modified Independent             General bed mobility comments:  EOB with visual and verbal cues,   Transfers Overall transfer level: Needs assistance Equipment used: Rolling walker (2 wheeled) Transfers: Sit to/from Stand Sit to Stand: Min guard         General transfer comment: physical cues/prompts and gestures required to initiate tasks    Balance Overall balance assessment: Needs assistance Sitting-balance support: Feet supported Sitting balance-Leahy Scale: Fair     Standing balance support: Bilateral upper extremity supported;During functional activity Standing balance-Leahy  Scale: Fair                             ADL either performed or assessed with clinical judgement   ADL Overall ADL's : Needs assistance/impaired Eating/Feeding: Set up;Supervision/ safety;Sitting   Grooming: Wash/dry hands;Wash/dry face;Standing;Min guard   Upper Body Bathing: Minimal assistance   Lower Body Bathing: Minimal assistance   Upper Body Dressing : Minimal assistance   Lower Body Dressing: Minimal assistance   Toilet Transfer: Min guard   Toileting- Clothing Manipulation and Hygiene: Minimal assistance       Functional mobility during ADLs: Min guard General ADL Comments: due to hx of advanced dementia, pt requires physical cues/prompts to initiate and complete tasks. Pt non verbal with giggles/laughs for responses     Vision   Additional Comments: unable to properly assess due to cognition     Perception     Praxis      Pertinent Vitals/Pain Pain Assessment: Faces Faces Pain Scale: No hurt Pain Intervention(s): Monitored during session     Hand Dominance Right   Extremity/Trunk Assessment Upper Extremity Assessment Upper Extremity Assessment: Generalized weakness   Lower Extremity Assessment Lower Extremity Assessment: Defer to PT evaluation       Communication Communication Communication: Expressive difficulties;Receptive difficulties   Cognition Arousal/Alertness: Awake/alert Behavior During Therapy: Anxious Overall Cognitive Status: History of cognitive impairments - at baseline                                 General Comments: pt nonverbal and  says "ahhh" and giggling with all questioning and interaction, occasional nodding appropriately to questions. slightly anxious with increase in HR with ambulation,    General Comments       Exercises     Shoulder Instructions      Home Living Family/patient expects to be discharged to:: Unsure                                 Additional Comments: pt  with advanced dementia, unable to provide any (reliable) information and no family/caregivers present to provide any information      Prior Functioning/Environment Level of Independence: Needs assistance        Comments: Per previous note, she is able to ambulate without assistive device at baseline but she requires assistance with eating, toileting, dressing and bathing.         OT Problem List: Decreased strength;Decreased activity tolerance;Decreased cognition;Decreased knowledge of use of DME or AE;Decreased range of motion;Impaired balance (sitting and/or standing);Decreased safety awareness      OT Treatment/Interventions: Self-care/ADL training;Therapeutic activities;Neuromuscular education;Patient/family education    OT Goals(Current goals can be found in the care plan section) Acute Rehab OT Goals Patient Stated Goal: none stated OT Goal Formulation: Patient unable to participate in goal setting  OT Frequency:     Barriers to D/C: Decreased caregiver support          Co-evaluation              AM-PAC PT "6 Clicks" Daily Activity     Outcome Measure Help from another person eating meals?: None Help from another person taking care of personal grooming?: A Little Help from another person toileting, which includes using toliet, bedpan, or urinal?: A Lot Help from another person bathing (including washing, rinsing, drying)?: A Lot Help from another person to put on and taking off regular upper body clothing?: A Little Help from another person to put on and taking off regular lower body clothing?: A Lot 6 Click Score: 16   End of Session Equipment Utilized During Treatment: Gait belt;Rolling walker  Activity Tolerance: Patient tolerated treatment well Patient left: in bed;with call bell/phone within reach;with bed alarm set  OT Visit Diagnosis: Unsteadiness on feet (R26.81);Other abnormalities of gait and mobility (R26.89);Other symptoms and signs involving  cognitive function;Cognitive communication deficit (R41.841);Muscle weakness (generalized) (M62.81)                Time: 6962-9528 OT Time Calculation (min): 26 min Charges:  OT General Charges $OT Visit: 1 Visit OT Evaluation $OT Eval Moderate Complexity: 1 Mod OT Treatments $Therapeutic Activity: 8-22 mins G-Codes: OT G-codes **NOT FOR INPATIENT CLASS** Functional Assessment Tool Used: AM-PAC 6 Clicks Daily Activity     Galen Manila 03/07/2018, 1:54 PM

## 2018-03-07 NOTE — Plan of Care (Signed)
Doing well, blood was successfully given

## 2018-03-11 DIAGNOSIS — E538 Deficiency of other specified B group vitamins: Secondary | ICD-10-CM | POA: Insufficient documentation

## 2018-03-11 NOTE — Progress Notes (Deleted)
HPI:   [name] is a 67 y.o. female, who is here today to follow on recent hospitalization.  She was admitted on 03/05/18 and discharged on 03/07/18. She was taken to the ER because episode of syncope. Hypotension that improved with IVF,severe dehydration. Echo: LVEF 60-65%.mild MR. Carotid Duplex:Minimal wall thickening or plaque.  Discharge to a SNF was recommended but due to no health insurance,she was discharged home.  Anemia: She received PRBC's transfusion x 1 unit.  Lab Results  Component Value Date   WBC 8.2 03/07/2018   HGB 9.6 (L) 03/07/2018   HCT 29.7 (L) 03/07/2018   MCV 79.8 03/07/2018   PLT 232 03/07/2018    FOB negative.  B12 deficiency:  Lab Results  Component Value Date   VITAMINB12 90 (L) 03/05/2018    Overall *** is feeling better, no new symptoms reported.       Review of Systems    Current Outpatient Medications on File Prior to Visit  Medication Sig Dispense Refill  . acetaminophen (TYLENOL) 325 MG tablet Take 325-650 mg by mouth every 6 (six) hours as needed (for headaches).    . cetirizine (ZYRTEC) 10 MG tablet Take 10 mg by mouth daily.     Marland Kitchen. donepezil (ARICEPT) 10 MG tablet Take 10 mg at bedtime 90 tablet 1  . ferrous sulfate 325 (65 FE) MG tablet Take 1 tablet (325 mg total) by mouth daily with breakfast. 30 tablet 0  . folic acid (FOLVITE) 1 MG tablet Take 1 tablet (1 mg total) by mouth daily. 30 tablet 0  . memantine (NAMENDA TITRATION PACK) tablet pack Take 5 mg by mouth once a day for 1 week then 5 mg two times a day for 1 week then 15 mg daily given in 5 mg & 10 mg separated doses for 1 week then 10 mg two times a day    . memantine (NAMENDA) 10 MG tablet Take 1 tablet (10 mg total) by mouth 2 (two) times daily. 180 tablet 2  . pantoprazole (PROTONIX) 20 MG tablet Take 1 tablet (20 mg total) by mouth daily. 30 tablet 0  . ranitidine (ZANTAC) 150 MG tablet Take 150 mg by mouth 2 (two) times daily as needed for  heartburn.    . triamcinolone (NASACORT ALLERGY 24HR) 55 MCG/ACT AERO nasal inhaler Place 2 sprays into the nose daily as needed (for seasonal allergies).     . vitamin B-12 (CYANOCOBALAMIN) 1000 MCG tablet Take 1 tablet (1,000 mcg total) by mouth daily. 30 tablet 0  . Vitamin D, Ergocalciferol, (DRISDOL) 50000 units CAPS capsule Take 1 capsule (50,000 Units total) by mouth once a week. FOR 16 WEEKS AND THEN ONCE A WEEK EVERY 2 WEEKS 16 capsule 1   No current facility-administered medications on file prior to visit.      Past Medical History:  Diagnosis Date  . Alzheimer's dementia   . Frequent headaches   . GERD (gastroesophageal reflux disease)    Allergies  Allergen Reactions  . Penicillins Hives and Other (See Comments)    Has patient had a PCN reaction causing immediate rash, facial/tongue/throat swelling, SOB or lightheadedness with hypotension: Yes Has patient had a PCN reaction causing severe rash involving mucus membranes or skin necrosis: Unk Has patient had a PCN reaction that required hospitalization: No Has patient had a PCN reaction occurring within the last 10 years: No If all of the above answers are "NO", then may proceed with Cephalosporin use.  Social History   Socioeconomic History  . Marital status: Widowed    Spouse name: Not on file  . Number of children: Not on file  . Years of education: Not on file  . Highest education level: Not on file  Occupational History  . Not on file  Social Needs  . Financial resource strain: Not on file  . Food insecurity:    Worry: Not on file    Inability: Not on file  . Transportation needs:    Medical: Not on file    Non-medical: Not on file  Tobacco Use  . Smoking status: Never Smoker  . Smokeless tobacco: Never Used  Substance and Sexual Activity  . Alcohol use: No  . Drug use: No  . Sexual activity: Never  Lifestyle  . Physical activity:    Days per week: Not on file    Minutes per session: Not on file   . Stress: Not on file  Relationships  . Social connections:    Talks on phone: Not on file    Gets together: Not on file    Attends religious service: Not on file    Active member of club or organization: Not on file    Attends meetings of clubs or organizations: Not on file    Relationship status: Not on file  Other Topics Concern  . Not on file  Social History Narrative  . Not on file    There were no vitals filed for this visit. There is no height or weight on file to calculate BMI.      Physical Exam  ASSESSMENT AND PLAN:  There are no diagnoses linked to this encounter.   No orders of the defined types were placed in this encounter.   No problem-specific Assessment & Plan notes found for this encounter.      Betty G. [place], MD  [facility]. [place] office.

## 2018-03-12 ENCOUNTER — Inpatient Hospital Stay: Payer: Self-pay | Admitting: Family Medicine

## 2018-03-12 DIAGNOSIS — Z2089 Contact with and (suspected) exposure to other communicable diseases: Secondary | ICD-10-CM

## 2018-03-13 ENCOUNTER — Inpatient Hospital Stay (HOSPITAL_COMMUNITY)
Admission: EM | Admit: 2018-03-13 | Discharge: 2018-03-18 | DRG: 178 | Disposition: A | Discharge status: Home / Self Care | Payer: Self-pay | Attending: Internal Medicine | Admitting: Internal Medicine

## 2018-03-13 ENCOUNTER — Emergency Department (HOSPITAL_COMMUNITY): Payer: Self-pay

## 2018-03-13 DIAGNOSIS — D649 Anemia, unspecified: Secondary | ICD-10-CM | POA: Diagnosis present

## 2018-03-13 DIAGNOSIS — Z597 Insufficient social insurance and welfare support: Secondary | ICD-10-CM

## 2018-03-13 DIAGNOSIS — K219 Gastro-esophageal reflux disease without esophagitis: Secondary | ICD-10-CM | POA: Diagnosis present

## 2018-03-13 DIAGNOSIS — R4182 Altered mental status, unspecified: Secondary | ICD-10-CM

## 2018-03-13 DIAGNOSIS — Z7189 Other specified counseling: Secondary | ICD-10-CM

## 2018-03-13 DIAGNOSIS — J189 Pneumonia, unspecified organism: Secondary | ICD-10-CM

## 2018-03-13 DIAGNOSIS — G309 Alzheimer's disease, unspecified: Secondary | ICD-10-CM | POA: Diagnosis present

## 2018-03-13 DIAGNOSIS — J690 Pneumonitis due to inhalation of food and vomit: Principal | ICD-10-CM | POA: Diagnosis present

## 2018-03-13 DIAGNOSIS — Z515 Encounter for palliative care: Secondary | ICD-10-CM

## 2018-03-13 DIAGNOSIS — R32 Unspecified urinary incontinence: Secondary | ICD-10-CM | POA: Diagnosis present

## 2018-03-13 DIAGNOSIS — R51 Headache: Secondary | ICD-10-CM | POA: Diagnosis present

## 2018-03-13 DIAGNOSIS — I959 Hypotension, unspecified: Secondary | ICD-10-CM | POA: Diagnosis present

## 2018-03-13 DIAGNOSIS — G301 Alzheimer's disease with late onset: Secondary | ICD-10-CM

## 2018-03-13 DIAGNOSIS — E538 Deficiency of other specified B group vitamins: Secondary | ICD-10-CM | POA: Diagnosis present

## 2018-03-13 DIAGNOSIS — R159 Full incontinence of feces: Secondary | ICD-10-CM | POA: Diagnosis present

## 2018-03-13 DIAGNOSIS — Z66 Do not resuscitate: Secondary | ICD-10-CM | POA: Diagnosis present

## 2018-03-13 DIAGNOSIS — F0281 Dementia in other diseases classified elsewhere with behavioral disturbance: Secondary | ICD-10-CM | POA: Diagnosis present

## 2018-03-13 DIAGNOSIS — Z880 Allergy status to penicillin: Secondary | ICD-10-CM

## 2018-03-13 LAB — COMPREHENSIVE METABOLIC PANEL
ALK PHOS: 100 U/L (ref 38–126)
ALT: 10 U/L — AB (ref 14–54)
AST: 20 U/L (ref 15–41)
Albumin: 3.4 g/dL — ABNORMAL LOW (ref 3.5–5.0)
Anion gap: 11 (ref 5–15)
BUN: 10 mg/dL (ref 6–20)
CALCIUM: 8.3 mg/dL — AB (ref 8.9–10.3)
CHLORIDE: 110 mmol/L (ref 101–111)
CO2: 18 mmol/L — AB (ref 22–32)
CREATININE: 0.98 mg/dL (ref 0.44–1.00)
GFR calc non Af Amer: 60 mL/min — ABNORMAL LOW (ref >60)
Glucose, Bld: 101 mg/dL — ABNORMAL HIGH (ref 65–99)
Potassium: 4.3 mmol/L (ref 3.5–5.1)
SODIUM: 139 mmol/L (ref 135–145)
Total Bilirubin: 0.5 mg/dL (ref 0.3–1.2)
Total Protein: 6.5 g/dL (ref 6.5–8.1)

## 2018-03-13 LAB — CBC WITH DIFFERENTIAL/PLATELET
BASOS ABS: 0 10*3/uL (ref 0.0–0.1)
Basophils Relative: 0 %
EOS ABS: 0 10*3/uL (ref 0.0–0.7)
Eosinophils Relative: 0 %
HCT: 33 % — ABNORMAL LOW (ref 36.0–46.0)
HEMOGLOBIN: 10.6 g/dL — AB (ref 12.0–15.0)
LYMPHS ABS: 1.1 10*3/uL (ref 0.7–4.0)
LYMPHS PCT: 10 %
MCH: 26.4 pg (ref 26.0–34.0)
MCHC: 32.1 g/dL (ref 30.0–36.0)
MCV: 82.1 fL (ref 78.0–100.0)
Monocytes Absolute: 0.4 10*3/uL (ref 0.1–1.0)
Monocytes Relative: 3 %
NEUTROS PCT: 87 %
Neutro Abs: 9.7 10*3/uL — ABNORMAL HIGH (ref 1.7–7.7)
Platelets: 219 10*3/uL (ref 150–400)
RBC: 4.02 MIL/uL (ref 3.87–5.11)
RDW: 16.5 % — ABNORMAL HIGH (ref 11.5–15.5)
WBC: 11.2 10*3/uL — AB (ref 4.0–10.5)

## 2018-03-13 LAB — TROPONIN I: Troponin I: <0.03 ng/mL (ref <0.03)

## 2018-03-13 MED ORDER — SODIUM CHLORIDE 0.9 % IV SOLN
INTRAVENOUS | Status: DC
  Administered 2018-03-13 – 2018-03-16 (×4): via INTRAVENOUS

## 2018-03-13 MED ORDER — ENOXAPARIN SODIUM 40 MG/0.4ML ~~LOC~~ SOLN
40.0000 mg | SUBCUTANEOUS | Status: DC
  Administered 2018-03-13 – 2018-03-17 (×5): 40 mg via SUBCUTANEOUS
  Filled 2018-03-13 (×7): qty 0.4

## 2018-03-13 MED ORDER — FAMOTIDINE IN NACL 20-0.9 MG/50ML-% IV SOLN
20.0000 mg | Freq: Two times a day (BID) | INTRAVENOUS | Status: DC
  Administered 2018-03-13: 20 mg via INTRAVENOUS
  Filled 2018-03-13 (×2): qty 50

## 2018-03-13 MED ORDER — LEVOFLOXACIN IN D5W 750 MG/150ML IV SOLN
750.0000 mg | INTRAVENOUS | Status: DC
  Administered 2018-03-13 – 2018-03-15 (×3): 750 mg via INTRAVENOUS
  Filled 2018-03-13 (×4): qty 150

## 2018-03-13 MED ORDER — SODIUM CHLORIDE 0.9 % IV SOLN
1500.0000 mg | Freq: Once | INTRAVENOUS | Status: DC
  Filled 2018-03-13: qty 1500

## 2018-03-13 NOTE — H&P (Signed)
History and Physical    Emily Curtis ZOX:[id number] DOB: [birth year] DOA: 03/13/2018  PCP: [place], Betty G, MD  Patient coming from:  Home - lives with family  Chief Complaint:  AMS  HPI: Emily Curtis is a 67 y.o. female with medical history significant of dementia (almost nonverbal, eats, drinks, and paces).  She is unaccompanied and was unable to provide history at the time of my evaluation.  HPI per PA Sofia: Pt reported to be less responsive than usual. Pt ate breakfast and had a small amount of vomiting. Pt was in the car for an extended period of time and may have become hot per nephew. He reports pt usually paces. Pt not pacing today.   She was admitted from 4/10-12 for probable vasovagal syncope with anemia, hgb 7.1 s/p 1 unit PRBC.    ED Course:  Choked and vomited a small amount and then later with an episode where she was not very responsive to family.  EMS gave IVF.  In ER, sleepy but arousable.  CXR with PNA.    Review of Systems: Unable to perform     Past Medical History:  Diagnosis Date  . Alzheimer's dementia   . Frequent headaches   . GERD (gastroesophageal reflux disease)     No past surgical history on file.  Social History   Socioeconomic History  . Marital status: Widowed    Spouse name: Not on file  . Number of children: Not on file  . Years of education: Not on file  . Highest education level: Not on file  Occupational History  . Not on file  Social Needs  . Financial resource strain: Not on file  . Food insecurity:    Worry: Not on file    Inability: Not on file  . Transportation needs:    Medical: Not on file    Non-medical: Not on file  Tobacco Use  . Smoking status: Never Smoker  . Smokeless tobacco: Never Used  Substance and Sexual Activity  . Alcohol use: No  . Drug use: No  . Sexual activity: Never  Lifestyle  . Physical activity:    Days per week: Not on file    Minutes per session: Not on file  . Stress: Not on file   Relationships  . Social connections:    Talks on phone: Not on file    Gets together: Not on file    Attends religious service: Not on file    Active member of club or organization: Not on file    Attends meetings of clubs or organizations: Not on file    Relationship status: Not on file  . Intimate partner violence:    Fear of current or ex partner: Not on file    Emotionally abused: Not on file    Physically abused: Not on file    Forced sexual activity: Not on file  Other Topics Concern  . Not on file  Social History Narrative  . Not on file    Allergies  Allergen Reactions  . Penicillins Hives and Other (See Comments)    Has patient had a PCN reaction causing immediate rash, facial/tongue/throat swelling, SOB or lightheadedness with hypotension: Yes Has patient had a PCN reaction causing severe rash involving mucus membranes or skin necrosis: Unk Has patient had a PCN reaction that required hospitalization: No Has patient had a PCN reaction occurring within the last 10 years: No If all of the above answers are "NO", then may  proceed with Cephalosporin use.     Family History  Problem Relation Age of Onset  . Cancer Mother   . Heart disease Mother   . Stroke Mother   . Hypertension Mother   . Heart disease Father   . Stroke Father   . Hypertension Father     Prior to Admission medications   Medication Sig Start Date End Date Taking? Authorizing Provider  acetaminophen (TYLENOL) 325 MG tablet Take 325-650 mg by mouth every 6 (six) hours as needed (for headaches).    [provider]  cetirizine (ZYRTEC) 10 MG tablet Take 10 mg by mouth daily.     [provider]  donepezil (ARICEPT) 10 MG tablet Take 10 mg at bedtime 03/03/18   [place], Betty G, MD  ferrous sulfate 325 (65 FE) MG tablet Take 1 tablet (325 mg total) by mouth daily with breakfast. 03/08/18   Burnadette Pop, MD  folic acid (FOLVITE) 1 MG tablet Take 1 tablet (1 mg total) by mouth daily.  03/08/18   Burnadette Pop, MD  memantine [facility] TITRATION PACK) tablet pack Take 5 mg by mouth once a day for 1 week then 5 mg two times a day for 1 week then 15 mg daily given in 5 mg & 10 mg separated doses for 1 week then 10 mg two times a day    [provider]  memantine (NAMENDA) 10 MG tablet Take 1 tablet (10 mg total) by mouth 2 (two) times daily. 03/03/18   [place], Betty G, MD  pantoprazole (PROTONIX) 20 MG tablet Take 1 tablet (20 mg total) by mouth daily. 12/05/16   Arby Barrette, MD  ranitidine (ZANTAC) 150 MG tablet Take 150 mg by mouth 2 (two) times daily as needed for heartburn.    [provider]  triamcinolone (NASACORT ALLERGY 24HR) 55 MCG/ACT AERO nasal inhaler Place 2 sprays into the nose daily as needed (for seasonal allergies).     [provider]  vitamin B-12 (CYANOCOBALAMIN) 1000 MCG tablet Take 1 tablet (1,000 mcg total) by mouth daily. 03/07/18   Burnadette Pop, MD  Vitamin D, Ergocalciferol, (DRISDOL) 50000 units CAPS capsule Take 1 capsule (50,000 Units total) by mouth once a week. FOR 16 WEEKS AND THEN ONCE A WEEK EVERY 2 WEEKS 01/20/18 03/21/18  [place], Betty G, MD    Physical Exam: Vitals:   03/13/18 2000 03/13/18 2030 03/13/18 2100 03/13/18 2215  BP: (!) 147/82 (!) 145/85 (!) 150/61 (!) 144/84  Pulse: 97 93 100 99  Resp: (!) 22 (!) 26 (!) 28 (!) 25  Temp:      TempSrc:      SpO2: 97% 97% 96% 95%     General:  Appears calm and comfortable and is NAD Eyes:  PERRL, EOMI, normal lids, iris ENT:  grossly normal hearing, would not open her mouth Neck:  no LAD, masses or thyromegaly; no carotid bruits Cardiovascular:  RRR, no m/r/g. No LE edema.  Respiratory:   CTA bilaterally with no wheezes/rales/rhonchi.  Normal respiratory effort. Abdomen:  soft, NT, ND, NABS Skin:  no rash or induration seen on limited exam Musculoskeletal:  grossly normal tone BUE/BLE, good passive ROM, no bony abnormality Lower extremity:  No LE edema.  Limited  foot exam with no ulcerations.  2+ distal pulses. Psychiatric:  Makes eye contact, nods her head but it is very unclear that her nods are accurate in response to questions, did not verbalize while I was in the room, did not follow commands  Neurologic:  Unable to assess    Radiological Exams on Admission: Dg Chest 2 View  Result Date: 03/13/2018 CLINICAL DATA:  Altered mental status. EXAM: CHEST - 2 VIEW COMPARISON:  03/05/2018.  11/28/2017. FINDINGS: Heart size normal. Low lung volumes with mild basilar atelectasis. Mild right perihilar/right middle lobe infiltrate. No pleural effusion or pneumothorax. Degenerative changes and scoliosis thoracic spine. Surgical clips right upper quadrant. IMPRESSION: Low lung volumes with mild basilar atelectasis. Mild right perihilar/right middle lobe infiltrate. Electronically Signed   By: Maisie Fushomas  Register   On: 03/13/2018 13:13    EKG: Independently reviewed.  NSR with rate 74; nonspecific ST changes with no evidence of acute ischemia   Labs on Admission: I have personally reviewed the available labs and imaging studies at the time of the admission.  Pertinent labs:   CO2 18 Glucose 101 Troponin <0.03 WBC 11.2 Hgb 10.6 - improved from 4/12 Blood cultures pending  Assessment/Plan Principal Problem:   Aspiration pneumonia (HCC) Active Problems:   Alzheimer's dementia with behavioral disturbance   Anemia   -patient with advanced AD with prior recent admission -Today's events are not entirely clear but she did have apparent mechanism of action for aspiration PNA -She was then stuck in a non-air conditioned car for a prolonged period of time -She had an episode of AMS - although it would appear to be very difficult to gauge that since she is quite altered at baseline -She has a mild leukocytosis and a CXR concerning for a "mild" RML PNA -Will place in observation status -NPO until speech therapy is able to do a swallow evaluation -Treat with  Levaquin, as this will provide coverage for aspiration PNA and would easily transitioned to PO if she is ready for d/c tomorrow -She overall appeared fairly well from an acute standpoint at the time of my evaluation    DVT prophylaxis: Lovenox  Code Status: DNR - based on prior admission status Family Communication: None present Disposition Plan:  Home once clinically improved Consults called: None  Admission status: It is my clinical opinion that referral for OBSERVATION is reasonable and necessary in this patient based on the above information provided. The aforementioned taken together are felt to place the patient at high risk for further clinical deterioration. However it is anticipated that the patient may be medically stable for discharge from the hospital within 24 to 48 hours.    Jonah BlueJennifer Alvino Lechuga MD [facility]  If note is complete, please contact covering daytime or nighttime physician. [email or web address] Password [email or web address]  03/13/2018, 10:34 PM

## 2018-03-13 NOTE — ED Provider Notes (Signed)
[facility] EMERGENCY DEPARTMENT Provider Note   CSN: [id number] Arrival date & time: 03/13/18  1213     History   Chief Complaint Chief Complaint  Patient presents with  . Hypotension  . Altered Mental Status    HPI Emily Curtis is a 67 y.o. female.  The history is provided by the patient. No language interpreter was used.  Altered Mental Status   This is a new problem. The current episode started less than 1 hour ago. The problem has not changed since onset.Associated symptoms include unresponsiveness. Her past medical history does not include CVA or TIA.  Pt reported to be less responsive than usual.  Pt ate breakfast and had a small amount of vomiting.  Pt was in the car for an extended period of time and may have become hot per nephew. He reports pt usually paces.  Pt not pacing today.    Past Medical History:  Diagnosis Date  . Alzheimer's dementia   . Frequent headaches   . GERD (gastroesophageal reflux disease)     Patient Active Problem List   Diagnosis Date Noted  . B12 deficiency 03/11/2018  . Hypokalemia 03/05/2018  . Anemia 03/05/2018  . GERD (gastroesophageal reflux disease) 11/28/2017  . Syncope 11/28/2017  . Fall 11/28/2017  . Severe dehydration 11/28/2017  . Alzheimer's dementia with behavioral disturbance 12/18/2016  . 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   OB History   None      Home Medications    Prior to Admission medications   Medication Sig Start Date End Date Taking? Authorizing Provider  acetaminophen (TYLENOL) 325 MG tablet Take 325-650 mg by mouth every 6 (six) hours as needed (for headaches).    [provider]  cetirizine (ZYRTEC) 10 MG tablet Take 10 mg by mouth daily.     [provider]  donepezil (ARICEPT) 10 MG tablet Take 10 mg at bedtime 03/03/18   [place], Betty G, MD  ferrous sulfate 325 (65 FE) MG tablet Take 1 tablet (325 mg total) by mouth daily with  breakfast. 03/08/18   Burnadette Pop, MD  folic acid (FOLVITE) 1 MG tablet Take 1 tablet (1 mg total) by mouth daily. 03/08/18   Burnadette Pop, MD  memantine [facility] TITRATION PACK) tablet pack Take 5 mg by mouth once a day for 1 week then 5 mg two times a day for 1 week then 15 mg daily given in 5 mg & 10 mg separated doses for 1 week then 10 mg two times a day    [provider]  memantine (NAMENDA) 10 MG tablet Take 1 tablet (10 mg total) by mouth 2 (two) times daily. 03/03/18   [place], Betty G, MD  pantoprazole (PROTONIX) 20 MG tablet Take 1 tablet (20 mg total) by mouth daily. 12/05/16   Arby Barrette, MD  ranitidine (ZANTAC) 150 MG tablet Take 150 mg by mouth 2 (two) times daily as needed for heartburn.    [provider]  triamcinolone (NASACORT ALLERGY 24HR) 55 MCG/ACT AERO nasal inhaler Place 2 sprays into the nose daily as needed (for seasonal allergies).     [provider]  vitamin B-12 (CYANOCOBALAMIN) 1000 MCG tablet Take 1 tablet (1,000 mcg total) by mouth daily. 03/07/18   Burnadette Pop, MD  Vitamin D, Ergocalciferol, (DRISDOL) 50000 units CAPS capsule Take 1 capsule (50,000 Units total) by mouth once a week. FOR 16 WEEKS AND THEN ONCE A WEEK EVERY 2  WEEKS 01/20/18 03/21/18  [place], Betty G, MD    Family History Family History  Problem Relation Age of Onset  . Cancer Mother   . Heart disease Mother   . Stroke Mother   . Hypertension Mother   . Heart disease Father   . Stroke Father   . Hypertension Father     Social History Social History   Tobacco Use  . Smoking status: Never Smoker  . Smokeless tobacco: Never Used  Substance Use Topics  . Alcohol use: No  . Drug use: No     Allergies   Penicillins   Review of Systems Review of Systems  All other systems reviewed and are negative.    Physical Exam Updated Vital Signs BP 113/70   Pulse 83   Temp 99.2 F (37.3 C) (Oral)   Resp (!) 23   SpO2 99%   Physical Exam    Constitutional: She is oriented to person, place, and time. She appears well-developed and well-nourished.  HENT:  Head: Normocephalic.  Right Ear: External ear normal.  Left Ear: External ear normal.  Eyes: Pupils are equal, round, and reactive to light. EOM are normal.  Neck: Normal range of motion.  Cardiovascular: Normal rate.  Pulmonary/Chest: Effort normal.  Abdominal: Soft. She exhibits no distension.  Musculoskeletal: Normal range of motion.  Neurological: She is alert and oriented to person, place, and time.  Skin: Skin is warm.  Psychiatric: She has a normal mood and affect.  Nursing note and vitals reviewed.    ED Treatments / Results  Labs (all labs ordered are listed, but only abnormal results are displayed) Labs Reviewed  CBC WITH DIFFERENTIAL/PLATELET - Abnormal; Notable for the following components:      Result Value   WBC 11.2 (*)    Hemoglobin 10.6 (*)    HCT 33.0 (*)    RDW 16.5 (*)    Neutro Abs 9.7 (*)    All other components within normal limits  COMPREHENSIVE METABOLIC PANEL  TROPONIN I  URINALYSIS, ROUTINE W REFLEX MICROSCOPIC    EKG None  Radiology Dg Chest 2 View  Result Date: 03/13/2018 CLINICAL DATA:  Altered mental status. EXAM: CHEST - 2 VIEW COMPARISON:  03/05/2018.  11/28/2017. FINDINGS: Heart size normal. Low lung volumes with mild basilar atelectasis. Mild right perihilar/right middle lobe infiltrate. No pleural effusion or pneumothorax. Degenerative changes and scoliosis thoracic spine. Surgical clips right upper quadrant. IMPRESSION: Low lung volumes with mild basilar atelectasis. Mild right perihilar/right middle lobe infiltrate. Electronically Signed   By: Maisie Fus  Register   On: 03/13/2018 13:13    Procedures Procedures (including critical care time)  Medications Ordered in ED Medications - No data to display   Initial Impression / Assessment and Plan / ED Course  I have reviewed the triage vital signs and the nursing  notes.  Pertinent labs & imaging results that were available during my care of the patient were reviewed by me and considered in my medical decision making (see chart for details).     MDM Pt given Iv ns x 1 liter. Pt awake and looks around.  Pt does not answer questions.  She occasionally says "woohoo"  Pt has elevated wbc count.  Chest xray shows right perihilar and right middle lobe pneumonia. Rocephin and Zithromax ordered.   Final Clinical Impressions(s) / ED Diagnoses   Final diagnoses:  Community acquired pneumonia of right lung, unspecified part of lung  Altered mental status, unspecified altered mental status type  Anemia,  unspecified type    ED Discharge Orders    None    Consult hospitalist to admit   Osie CheeksSofia, Leslie K, PA-C 03/13/18 1551    Shaune PollackIsaacs, Cameron, MD 03/13/18 1739

## 2018-03-13 NOTE — ED Triage Notes (Signed)
[facility] family reported decline in mental status. EMS reports initially patient was hypotensive, received 1 L NS in route, on arrival BP's within normal limits. Pt is nonverbal at baseline, occasionally shouts "woohoo." pt incontinent of feces on arrival, all sheets and clothing removed and changed.

## 2018-03-14 DIAGNOSIS — Z515 Encounter for palliative care: Secondary | ICD-10-CM

## 2018-03-14 DIAGNOSIS — G300 Alzheimer's disease with early onset: Secondary | ICD-10-CM

## 2018-03-14 DIAGNOSIS — Z7189 Other specified counseling: Secondary | ICD-10-CM

## 2018-03-14 LAB — CBC WITH DIFFERENTIAL/PLATELET
BASOS ABS: 0 10*3/uL (ref 0.0–0.1)
BASOS PCT: 0 %
EOS PCT: 0 %
Eosinophils Absolute: 0 10*3/uL (ref 0.0–0.7)
HCT: 33.7 % — ABNORMAL LOW (ref 36.0–46.0)
Hemoglobin: 10.8 g/dL — ABNORMAL LOW (ref 12.0–15.0)
Lymphocytes Relative: 12 %
Lymphs Abs: 1 10*3/uL (ref 0.7–4.0)
MCH: 25.9 pg — ABNORMAL LOW (ref 26.0–34.0)
MCHC: 32 g/dL (ref 30.0–36.0)
MCV: 80.8 fL (ref 78.0–100.0)
MONO ABS: 0.3 10*3/uL (ref 0.1–1.0)
Monocytes Relative: 3 %
Neutro Abs: 7.4 10*3/uL (ref 1.7–7.7)
Neutrophils Relative %: 85 %
PLATELETS: 207 10*3/uL (ref 150–400)
RBC: 4.17 MIL/uL (ref 3.87–5.11)
RDW: 16.7 % — AB (ref 11.5–15.5)
WBC: 8.6 10*3/uL (ref 4.0–10.5)

## 2018-03-14 LAB — BASIC METABOLIC PANEL
Anion gap: 10 (ref 5–15)
BUN: 9 mg/dL (ref 6–20)
CALCIUM: 8.2 mg/dL — AB (ref 8.9–10.3)
CO2: 17 mmol/L — ABNORMAL LOW (ref 22–32)
Chloride: 107 mmol/L (ref 101–111)
Creatinine, Ser: 0.75 mg/dL (ref 0.44–1.00)
GFR calc Af Amer: >60 mL/min (ref >60)
GLUCOSE: 122 mg/dL — AB (ref 65–99)
Potassium: 3.7 mmol/L (ref 3.5–5.1)
SODIUM: 134 mmol/L — AB (ref 135–145)

## 2018-03-14 MED ORDER — ACETAMINOPHEN 325 MG PO TABS
650.0000 mg | ORAL_TABLET | Freq: Four times a day (QID) | ORAL | Status: DC | PRN
  Administered 2018-03-14: 650 mg via ORAL
  Filled 2018-03-14: qty 2

## 2018-03-14 MED ORDER — PANTOPRAZOLE SODIUM 20 MG PO TBEC
20.0000 mg | DELAYED_RELEASE_TABLET | Freq: Every day | ORAL | Status: DC
  Administered 2018-03-14 – 2018-03-18 (×5): 20 mg via ORAL
  Filled 2018-03-14 (×5): qty 1

## 2018-03-14 MED ORDER — LORATADINE 10 MG PO TABS
10.0000 mg | ORAL_TABLET | Freq: Every day | ORAL | Status: DC
  Administered 2018-03-14 – 2018-03-18 (×5): 10 mg via ORAL
  Filled 2018-03-14 (×5): qty 1

## 2018-03-14 MED ORDER — MEMANTINE HCL 10 MG PO TABS
10.0000 mg | ORAL_TABLET | Freq: Two times a day (BID) | ORAL | Status: DC
  Administered 2018-03-14 – 2018-03-18 (×9): 10 mg via ORAL
  Filled 2018-03-14 (×10): qty 1

## 2018-03-14 MED ORDER — FAMOTIDINE 20 MG PO TABS
20.0000 mg | ORAL_TABLET | Freq: Two times a day (BID) | ORAL | Status: DC
  Administered 2018-03-14 – 2018-03-18 (×9): 20 mg via ORAL
  Filled 2018-03-14 (×9): qty 1

## 2018-03-14 MED ORDER — DONEPEZIL HCL 10 MG PO TABS
10.0000 mg | ORAL_TABLET | Freq: Every day | ORAL | Status: DC
  Administered 2018-03-14 – 2018-03-17 (×4): 10 mg via ORAL
  Filled 2018-03-14 (×4): qty 1

## 2018-03-14 MED ORDER — VITAMIN B-12 1000 MCG PO TABS
1000.0000 ug | ORAL_TABLET | Freq: Every day | ORAL | Status: DC
  Administered 2018-03-14 – 2018-03-18 (×5): 1000 ug via ORAL
  Filled 2018-03-14 (×5): qty 1

## 2018-03-14 NOTE — Plan of Care (Signed)
  Problem: Health Behavior/Discharge Planning: Goal: Ability to manage health-related needs will improve Outcome: Progressing   Problem: Clinical Measurements: Goal: Ability to maintain clinical measurements within normal limits will improve Outcome: Progressing Goal: Will remain free from infection Outcome: Progressing Goal: Diagnostic test results will improve Outcome: Progressing Goal: Respiratory complications will improve Outcome: Progressing Goal: Cardiovascular complication will be avoided Outcome: Progressing   Problem: Clinical Measurements: Goal: Will remain free from infection Outcome: Progressing   Problem: Clinical Measurements: Goal: Diagnostic test results will improve Outcome: Progressing

## 2018-03-14 NOTE — Progress Notes (Signed)
PROGRESS NOTE    Emily Curtis  WUJ:[id number] DOB: 09/27/1951 DOA: 03/13/2018 PCP: [name], Betty G, MD   Outpatient Specialists:     Brief Narrative:  Emily Curtis is a 67 y.o. female with medical history significant of dementia (almost nonverbal, eats, drinks, and paces).  Family reported patient to be less responsive than usual. Pt ate breakfast and had a small amount of vomiting. Pt was in the car for an extended period of time and may have become hot per nephew.   She was admitted from 4/10-12 for probable vasovagal syncope with anemia, hgb 7.1 s/p 1 unit PRBC.  Chest x ray was indicative for RML thought to be aspiration       Assessment & Plan:   Principal Problem:   Aspiration pneumonia (HCC) Active Problems:   Alzheimer's dementia with behavioral disturbance   Anemia   Aspiration pneumonia -PCN allergy-- was given levaquin in the ER-- will continue for now and monitor for improvement -blood cultures pending -seen by speech and given regular diet with no signs of aspiration  Severe alzheimer's dementia -palliative care consult -3-4 recent ER/hospitalizations -most present with hypotension from poor PO intake -patient virtually non-verbal  Nursing reports patient's fingers and toenails had dried stool under as well as stool on her chest.  Last admission, SNF was recommended but patient had no insurance-- family is trying to get medicaid/medicare.  Family seems overwhelmed with her needs and is doing the best they can.  ? Hospice appropriate?  DVT prophylaxis:  Lovenox   Code Status: DNR   Family Communication: Called sister  Disposition Plan:  pending   Consultants:  Palliative care  Subjective: Getting cleaned up after bowel and bladder incontinence-- did not speak  Objective: Vitals:   03/13/18 2100 03/13/18 2215 03/13/18 2254 03/14/18 0444  BP: (!) 150/61 (!) 144/84 140/89 140/80  Pulse: 100 99 97 (!) 116  Resp: (!) 28 (!) 25 17 16     Temp:   99.8 F (37.7 C) (!) 100.5 F (38.1 C)  TempSrc:   Oral Oral  SpO2: 96% 95% 98% 95%  Weight:   76.5 kg (168 lb 10.4 oz)     Intake/Output Summary (Last 24 hours) at 03/14/2018 1252 Last data filed at 03/14/2018 0600 Gross per 24 hour  Intake 725 ml  Output -  Net 725 ml   Filed Weights   03/13/18 2254  Weight: 76.5 kg (168 lb 10.4 oz)    Examination:  General exam: in bed, NAD Respiratory system: no wheezing, right coarse sounds Cardiovascular system: tachy to low 100s Gastrointestinal system: +BS, soft Central nervous system: Alert- tracks me in room, good eye contact but non-verbal Extremities: Symmetric 5 x 5 power. Skin: few moles, skin tags     Data Reviewed: I have personally reviewed following labs and imaging studies  CBC: Recent Labs  Lab 03/13/18 1249 03/14/18 0515  WBC 11.2* 8.6  NEUTROABS 9.7* 7.4  HGB 10.6* 10.8*  HCT 33.0* 33.7*  MCV 82.1 80.8  PLT 219 207   Basic Metabolic Panel: Recent Labs  Lab 03/13/18 1249 03/14/18 0515  NA 139 134*  K 4.3 3.7  CL 110 107  CO2 18* 17*  GLUCOSE 101* 122*  BUN 10 9  CREATININE 0.98 0.75  CALCIUM 8.3* 8.2*   GFR: Estimated Creatinine Clearance: 64.9 mL/min (by C-G formula based on SCr of 0.75 mg/dL). Liver Function Tests: Recent Labs  Lab 03/13/18 1249  AST 20  ALT 10*  ALKPHOS 100  BILITOT 0.5  PROT 6.5  ALBUMIN 3.4*   No results for input(s): LIPASE, AMYLASE in the last 168 hours. No results for input(s): AMMONIA in the last 168 hours. Coagulation Profile: No results for input(s): INR, PROTIME in the last 168 hours. Cardiac Enzymes: Recent Labs  Lab 03/13/18 1249  TROPONINI <0.03   BNP (last 3 results) No results for input(s): PROBNP in the last 8760 hours. HbA1C: No results for input(s): HGBA1C in the last 72 hours. CBG: No results for input(s): GLUCAP in the last 168 hours. Lipid Profile: No results for input(s): CHOL, HDL, LDLCALC, TRIG, CHOLHDL, LDLDIRECT in the  last 72 hours. Thyroid Function Tests: No results for input(s): TSH, T4TOTAL, FREET4, T3FREE, THYROIDAB in the last 72 hours. Anemia Panel: No results for input(s): VITAMINB12, FOLATE, FERRITIN, TIBC, IRON, RETICCTPCT in the last 72 hours. Urine analysis:    Component Value Date/Time   COLORURINE YELLOW 03/05/2018 2055   APPEARANCEUR HAZY (A) 03/05/2018 2055   LABSPEC 1.023 03/05/2018 2055   PHURINE 5.0 03/05/2018 2055   GLUCOSEU NEGATIVE 03/05/2018 2055   HGBUR NEGATIVE 03/05/2018 2055   BILIRUBINUR NEGATIVE 03/05/2018 2055   KETONESUR NEGATIVE 03/05/2018 2055   PROTEINUR 30 (A) 03/05/2018 2055   NITRITE NEGATIVE 03/05/2018 2055   LEUKOCYTESUR NEGATIVE 03/05/2018 2055     )No results found for this or any previous visit (from the past 240 hour(s)).    Anti-infectives (From admission, onward)   Start     Dose/Rate Route Frequency Ordered Stop   03/13/18 1745  levofloxacin (LEVAQUIN) IVPB 750 mg     750 mg 100 mL/hr over 90 Minutes Intravenous Every 24 hours 03/13/18 1732 03/18/18 1744   03/13/18 1615  vancomycin (VANCOCIN) 1,500 mg in sodium chloride 0.9 % 500 mL IVPB  Status:  Discontinued     1,500 mg 250 mL/hr over 120 Minutes Intravenous  Once 03/13/18 1610 03/13/18 1643       Radiology Studies: Dg Chest 2 View  Result Date: 03/13/2018 CLINICAL DATA:  Altered mental status. EXAM: CHEST - 2 VIEW COMPARISON:  03/05/2018.  11/28/2017. FINDINGS: Heart size normal. Low lung volumes with mild basilar atelectasis. Mild right perihilar/right middle lobe infiltrate. No pleural effusion or pneumothorax. Degenerative changes and scoliosis thoracic spine. Surgical clips right upper quadrant. IMPRESSION: Low lung volumes with mild basilar atelectasis. Mild right perihilar/right middle lobe infiltrate. Electronically Signed   By: Maisie Fus  Register   On: 03/13/2018 13:13        Scheduled Meds: . donepezil  10 mg Oral QHS  . enoxaparin (LOVENOX) injection  40 mg Subcutaneous Q24H    . famotidine  20 mg Oral BID  . loratadine  10 mg Oral Daily  . memantine  10 mg Oral BID  . pantoprazole  20 mg Oral Daily  . vitamin B-12  1,000 mcg Oral Daily   Continuous Infusions: . sodium chloride 50 mL/hr at 03/14/18 1100  . levofloxacin (LEVAQUIN) IV Stopped (03/13/18 1958)     LOS: 0 days    Time spent: 35 min    Joseph Art, DO [facility] Pager [phone]  If 7PM-7AM, please contact night-coverage [email or web address] Password [facility] 03/14/2018, 12:52 PM

## 2018-03-14 NOTE — Consult Note (Signed)
Consultation Note Date: 03/14/2018   Patient Name: Emily Curtis  DOB: 1950-12-21  MRN: [id number]  Age / Sex: 67 y.o., female  PCP: [place], Betty G, MD Referring Physician: Joseph Art, DO  Reason for Consultation: Establishing goals of care and Psychosocial/spiritual support  HPI/Patient Profile: 67 y.o. female  with past medical history of Alzheimer's dementia (moderate to severe), GERD, B12 deficiency, anemia admitted on 03/13/2018 with altered mental status.  Per HPI, patient was not as alert as she usually was.  She was observed to have a choking episode and she vomited. Chest x-ray reflected right middle lobe pneumonia, questionable aspiration event.  Patient was just admitted to the hospital from 03/05/2018 through 03/07/2018 secondary to syncopal episode  Consult ordered for goals of care.   Clinical Assessment and Goals of Care: Patient seen, chart reviewed.  Spoke to patient's sister, Emily Curtis.  Patient herself is unable to participate in goals of care discussion secondary to the severity of her dementia.  She is nonverbal at baseline.  Her family states that she does eat, is able to feed herself and she paces and attempts to leave the leave  house.  She is incontinent of bladder and bowel.  Per Mrs. Emily Curtis, patient's husband died in 04/19/16 and she came to live with her and her son, Emily Curtis.  Her dementia has worsened markedly since that time.  Per her sister she does not have children  Patient's healthcare proxy would be her sister, Emily Curtis [phone]    SUMMARY OF RECOMMENDATIONS   DNR/DNI care limits set Patient unfortunately is not eligible for Medicare yet.  Per sister, they are in the process of obtaining Medicaid.  Did speak to social worker who is contacting finance department to see if we can ascertain where patient is in the Medicaid process.  Family is reaching the  point where it is difficult to care for her at home and are interested in pursuing a skilled nursing facility, memory care unit Home with home health is not an option again secondary to no health insurance.  I do not believe she would meet hospice support in the home at this point.  If she continues to exhibit multiple hospitalizations, recurrent aspiration pneumonia, this may become an additional resource in the near future.  She has been seen by speech and language services with no findings of aspiration Code Status/Advance Care Planning:  DNR   Palliative Prophylaxis:   Aspiration, Bowel Regimen, Delirium Protocol, Eye Care, Frequent Pain Assessment, Oral Care and Turn Reposition  Additional Recommendations (Limitations, Scope, Preferences):  Full Scope Treatment except for DNR/DNI  Psycho-social/Spiritual:   Desire for further Chaplaincy support:no  Additional Recommendations: Referral to Community Resources   Prognosis:   Unable to determine  Discharge Planning: To Be Determined      Primary Diagnoses: Present on Admission: . Aspiration pneumonia (HCC) . Alzheimer's dementia with behavioral disturbance . Anemia   I have reviewed the medical record, interviewed the patient and family, and examined the patient. The following  aspects are pertinent.  Past Medical History:  Diagnosis Date  . Alzheimer's dementia   . Frequent headaches   . GERD (gastroesophageal reflux disease)    Social History   Socioeconomic History  . Marital status: Widowed    Spouse name: Not on file  . Number of children: Not on file  . Years of education: Not on file  . Highest education level: Not on file  Occupational History  . Not on file  Social Needs  . Financial resource strain: Not on file  . Food insecurity:    Worry: Not on file    Inability: Not on file  . Transportation needs:    Medical: Not on file    Non-medical: Not on file  Tobacco Use  . Smoking status: Never  Smoker  . Smokeless tobacco: Never Used  Substance and Sexual Activity  . Alcohol use: No  . Drug use: No  . Sexual activity: Never  Lifestyle  . Physical activity:    Days per week: Not on file    Minutes per session: Not on file  . Stress: Not on file  Relationships  . Social connections:    Talks on phone: Not on file    Gets together: Not on file    Attends religious service: Not on file    Active member of club or organization: Not on file    Attends meetings of clubs or organizations: Not on file    Relationship status: Not on file  Other Topics Concern  . Not on file  Social History Narrative  . Not on file   Family History  Problem Relation Age of Onset  . Cancer Mother   . Heart disease Mother   . Stroke Mother   . Hypertension Mother   . Heart disease Father   . Stroke Father   . Hypertension Father    Scheduled Meds: . donepezil  10 mg Oral QHS  . enoxaparin (LOVENOX) injection  40 mg Subcutaneous Q24H  . famotidine  20 mg Oral BID  . loratadine  10 mg Oral Daily  . memantine  10 mg Oral BID  . pantoprazole  20 mg Oral Daily  . vitamin B-12  1,000 mcg Oral Daily   Continuous Infusions: . sodium chloride 50 mL/hr at 03/14/18 1100  . levofloxacin (LEVAQUIN) IV Stopped (03/13/18 1958)   PRN Meds:.acetaminophen Medications Prior to Admission:  Prior to Admission medications   Medication Sig Start Date End Date Taking? Authorizing Provider  acetaminophen (TYLENOL) 325 MG tablet Take 325-650 mg by mouth every 6 (six) hours as needed (for headaches).   Yes [provider]  cetirizine (ZYRTEC) 10 MG tablet Take 10 mg by mouth daily.    Yes [provider]  donepezil (ARICEPT) 10 MG tablet Take 10 mg at bedtime 03/03/18  Yes [name], Betty G, MD  ferrous sulfate 325 (65 FE) MG tablet Take 1 tablet (325 mg total) by mouth daily with breakfast. 03/08/18  Yes Burnadette PopAdhikari, Amrit, MD  folic acid (FOLVITE) 1 MG tablet Take 1 tablet (1 mg total) by mouth  daily. 03/08/18  Yes Burnadette PopAdhikari, Amrit, MD  memantine (NAMENDA) 10 MG tablet Take 10 mg by mouth 2 (two) times daily.   Yes [provider]  pantoprazole (PROTONIX) 20 MG tablet Take 1 tablet (20 mg total) by mouth daily. 12/05/16  Yes Arby BarrettePfeiffer, Marcy, MD  ranitidine (ZANTAC) 150 MG tablet Take 150 mg by mouth 2 (two) times daily as needed for heartburn.  Yes [provider]  triamcinolone (NASACORT ALLERGY 24HR) 55 MCG/ACT AERO nasal inhaler Place 2 sprays into the nose daily as needed (for seasonal allergies).    Yes [provider]  vitamin B-12 (CYANOCOBALAMIN) 1000 MCG tablet Take 1 tablet (1,000 mcg total) by mouth daily. 03/07/18  Yes Burnadette Pop, MD  Vitamin D, Ergocalciferol, (DRISDOL) 50000 units CAPS capsule Take 1 capsule (50,000 Units total) by mouth once a week. FOR 16 WEEKS AND THEN ONCE A WEEK EVERY 2 WEEKS 01/20/18 03/21/18 Yes [place], Betty G, MD   Allergies  Allergen Reactions  . Penicillins Hives and Other (See Comments)    Has patient had a PCN reaction causing immediate rash, facial/tongue/throat swelling, SOB or lightheadedness with hypotension: Yes Has patient had a PCN reaction causing severe rash involving mucus membranes or skin necrosis: Unk Has patient had a PCN reaction that required hospitalization: No Has patient had a PCN reaction occurring within the last 10 years: No If all of the above answers are "NO", then may proceed with Cephalosporin use.    Review of Systems  Unable to perform ROS: Dementia    Physical Exam  Constitutional: She appears well-developed and well-nourished.  Older female, alert, in no acute distress.  Sitting in bed feeding herself lunch  HENT:  Head: Normocephalic and atraumatic.  Neck: Normal range of motion.  Cardiovascular: Normal rate.  Pulmonary/Chest:  No increased work of breathing noted at rest  Neurological: She is alert.  Skin: Skin is warm and dry. There is pallor.  Psychiatric:  No overt  agitation Patient is nonverbal  Nursing note and vitals reviewed.   Vital Signs: BP 140/80 (BP Location: Left Arm)   Pulse (!) 116   Temp (!) 100.5 F (38.1 C) (Oral)   Resp 16   Wt 76.5 kg (168 lb 10.4 oz)   SpO2 95%   BMI 32.94 kg/m  Pain Scale: 0-10       SpO2: SpO2: 95 % O2 Device:SpO2: 95 % O2 Flow Rate: .   IO: Intake/output summary:   Intake/Output Summary (Last 24 hours) at 03/14/2018 1605 Last data filed at 03/14/2018 0600 Gross per 24 hour  Intake 725 ml  Output -  Net 725 ml    LBM: Last BM Date: 03/14/18 Baseline Weight: Weight: 76.5 kg (168 lb 10.4 oz) Most recent weight: Weight: 76.5 kg (168 lb 10.4 oz)     Palliative Assessment/Data:   Flowsheet Rows     Most Recent Value  Intake Tab  Referral Department  Hospitalist  Unit at Time of Referral  Med/Surg Unit  Palliative Care Primary Diagnosis  Sepsis/Infectious Disease  Date Notified  03/14/18  Palliative Care Type  New Palliative care  Reason for referral  Clarify Goals of Care, Psychosocial or Spiritual support  Date of Admission  03/13/18  Date first seen by Palliative Care  03/14/18  # of days Palliative referral response time  0 Day(s)  # of days IP prior to Palliative referral  1  Clinical Assessment  Palliative Performance Scale Score  40%  Pain Max last 24 hours  Not able to report  Pain Min Last 24 hours  Not able to report  Dyspnea Max Last 24 Hours  Not able to report  Dyspnea Min Last 24 hours  Not able to report  Nausea Max Last 24 Hours  Not able to report  Nausea Min Last 24 Hours  Not able to report  Anxiety Max Last 24 Hours  Not able to  report  Anxiety Min Last 24 Hours  Not able to report  Other Max Last 24 Hours  Not able to report  Psychosocial & Spiritual Assessment  Palliative Care Outcomes  Patient/Family meeting held?  Yes  Who was at the meeting?  sister  Patient/Family wishes: Interventions discontinued/not started   Mechanical Ventilation, Vasopressors    Palliative Care follow-up planned  No      Time In: 1515 Time Out: 1600 Time Total: 45 min Greater than 50%  of this time was spent counseling and coordinating care related to the above assessment and plan.  Signed by: Irean Hong, NP   Please contact Palliative Medicine Team phone at [phone] for questions and concerns.  For individual provider: See Loretha Stapler

## 2018-03-14 NOTE — Clinical Social Work Note (Signed)
Clinical Social Work Assessment  Patient Details  Name: Emily Curtis MRN: [id number] Date of Birth: December 05, 1950  Date of referral:  03/14/18               Reason for consult:  Facility Placement, Discharge Planning, Insurance Barriers, Frequent Admissions / ED Visits                Permission sought to share information with:  Family Supports Permission granted to share information::  No(pt nonverbal and has advanced dementia)  Name::      Steffanie [place] and [place]::     Relationship::   nephew and sister  Contact Information:   [phone] (home) [phone] (shared cell phone)  Housing/Transportation Living arrangements for the past 2 months:  [place] of Information:  Other (Comment Required)(nephew) Patient Interpreter Needed:  None Criminal Activity/Legal Involvement Pertinent to Current Situation/Hospitalization:  No - Comment as needed Significant Relationships:  Other Family Members Lives with:  Relatives, Siblings Do you feel safe going back to the place where you live?  No Need for family participation in patient care:  Yes (Comment)(pt nonverbal, incontinent)  Care giving concerns:  Pt came in with feces under nails, has no insurance and advanced dementia, pt is non verbal. Pt lives with family members who state that they are trying to get her placement but are unable as pt does not have insurance.    Social Worker assessment / plan:  CSW met with pt nephew Alroy Dust at bedside. Pt nephew states that pt is nonverbal, lives with him and his mother who is the pt's sister. Pt sister also has disability needs. When asked if there is anyone else who cares for pt, pt nephew states there is not. Pt does not have insurance, pt nephew states they have an appointment to check on Medicaid application on Tuesday. When asked pt nephew states that he thinks they manage fine with pt at home until she feels very sick since pt is nonverbal.   Pt nephew then  stated that CSW could contact pt sister. Pt sister and nephew and pt share cell phone but also have house phone. When CSW attempted to call pt sister there was no answer and no voicemail set up. CSW will attempt to contact sister again to determine where they are in Medicaid process, CSW will also call financial counseling, unsure if pt will qualify for 30 day LOG.   Employment status:   disabled Forensic scientist:  Self Pay (Medicaid Pending) PT Recommendations:  [place] / Referral to community resources:  [place]  Patient/Family's Response to care:  Pt nephew states understanding of recommendations and CSW role.   Patient/Family's Understanding of and Emotional Response to Diagnosis, Current Treatment, and Prognosis:  Pt nephew states that he understands diagnosis, current treatment and prognosis. However, pt nephew was delayed at times to respond, and he does state that pt is well taken care of at home despite pt having feces unders toe and fingernails upon arrival/multiple hospitalizations. Pt nephew had relatively flat affect, so unable to assess emotional reaction to pt care.   Emotional Assessment Appearance:  Disheveled, Appears stated age Attitude/Demeanor/Rapport:  Unable to Assess Affect (typically observed):  Unable to Assess Orientation:  (unable to assess as pt is nonverbal) Alcohol / Substance use:  Not Applicable Psych involvement (Current and /or in the community):  No (Comment)  Discharge Needs  Concerns to be addressed:  Cognitive Concerns, Discharge Planning  Concerns, Home Safety Concerns, Care Coordination Readmission within the last 30 days:    Current discharge risk:  Physical Impairment, Dependent with Mobility, Cognitively Impaired Barriers to Discharge:  Continued Medical Work up, Inadequate or no insurance   Alexander Mt, [place] 03/14/2018, 4:47 PM

## 2018-03-14 NOTE — Evaluation (Signed)
Clinical/Bedside Swallow Evaluation Patient Details  Name: Bernarda CaffeyCathy Lynch Angert MRN: [id number] Date of Birth: 03/09/1951  Today's Date: 03/14/2018 Time: SLP Start Time (ACUTE ONLY): 0810 SLP Stop Time (ACUTE ONLY): 0831 SLP Time Calculation (min) (ACUTE ONLY): 21 min  Past Medical History:  Past Medical History:  Diagnosis Date  . Alzheimer's dementia   . Frequent headaches   . GERD (gastroesophageal reflux disease)    Past Surgical History: No past surgical history on file. HPI:  Pt is a 67 year old female with dementia, nonverbal at baseline, admitted after an episode of AMS. Pt was in a non airconditioned car for a prolonged period. Observed to choke and vomit. Found to have a mild RML pna. Pt eats and drinks independently at baseline.    Assessment / Plan / Recommendation Clinical Impression  Pt observed to have nomral swallow function. She was able to self feed appropriately, takes small bites and sips. Recommend pt resume a regular diet and thin liquids. No SLP f/u needed.  SLP Visit Diagnosis: Dysphagia, unspecified (R13.10)    Aspiration Risk  Mild aspiration risk    Diet Recommendation Regular;Thin liquid   Liquid Administration via: Straw Medication Administration: Whole meds with liquid Supervision: Patient able to self feed Postural Changes: Seated upright at 90 degrees    Other  Recommendations Oral Care Recommendations: Oral care BID   Follow up Recommendations None      Frequency and Duration            Prognosis        Swallow Study   General HPI: Pt is a 67 year old female with dementia, nonverbal at baseline, admitted after an episode of AMS. Pt was in a non airconditioned car for a prolonged period. Observed to choke and vomit. Found to have a mild RML pna. Pt eats and drinks independently at baseline.  Type of Study: Bedside Swallow Evaluation Previous Swallow Assessment: 11/29/17 - BSE normal swallow, admitted for syncope Respiratory Status: Room  air History of Recent Intubation: No Behavior/Cognition: Alert;Cooperative Oral Cavity Assessment: Within Functional Limits Oral Care Completed by SLP: No Oral Cavity - Dentition: Adequate natural dentition Vision: Functional for self-feeding Self-Feeding Abilities: Able to feed self Patient Positioning: Upright in bed Baseline Vocal Quality: Not observed Volitional Cough: Cognitively unable to elicit Volitional Swallow: Unable to elicit    Oral/Motor/Sensory Function Overall Oral Motor/Sensory Function: (appears WNL)   Ice Chips     Thin Liquid Thin Liquid: Within functional limits Presentation: Cup;Straw;Self Fed    Nectar Thick Nectar Thick Liquid: Not tested   Honey Thick Honey Thick Liquid: Not tested   Puree Puree: Within functional limits Presentation: Self Fed;Spoon   Solid   GO   Solid: Within functional limits Presentation: Self Fed       Harlon DittyBonnie Dickey Caamano, MA CCC-SLP [phone]  Claudine MoutonDeBlois, Isiac Breighner Caroline 03/14/2018,8:38 AM

## 2018-03-14 NOTE — Social Work (Addendum)
CSW acknowledging PT recommendations, per CSW note on previous admissions pt would require an LOG in order to discharge to SNF. Per CSW note on 4/12 "At this time, patient does not qualify for an LOG and family would be required to pay privately for SNF for long term care."  Due to advanced dementia/Alzheimers, LTC placement is perhaps more appropriate at this time. CSW to stop by pt room and speak with any family present.   4:56pm- CSW unable to reach financial counseling department, will attempt if pt is still here Monday. Completed assessment but unable to reach pt sister and pt nephew did not have any information about Medicaid application except that they had an appointment with DSS Tuesday.  At current time unsure where pt family is in the Medicaid process so again, unsure if pt would qualify for 30 day LOG for SNF stay. CSW continuing to follow.    Doy HutchingIsabel H Clydette Curtis, LCSWA [facility] Clinical Social Work [phone]

## 2018-03-14 NOTE — Progress Notes (Signed)
Physical Therapy Evaluation Patient Details Name: Emily Curtis MRN: [id number] DOB: 10/28/1951 Today's Date: 03/14/2018   History of Present Illness  Emily GreenhouseCathy Curtis Hastyis a 67 y.o.femalewith medical history significant ofdementia (almost nonverbal, eats, drinks, and paces). Family reported patient to be less responsive than usual. Pt ate breakfast and had a small amount of vomiting. Pt was in the car for an extended period of time and may have become hot per nephew. She was admitted from 4/10-12 for probable vasovagal syncope with anemia, hgb 7.1 s/p 1 unit PRBC.  Chest x ray was indicative for RML thought to be aspiration.    Clinical Impression  Pt admitted with above diagnosis. Pt currently with functional limitations due to the deficits listed below (see PT Problem List). Pt was soiled on arrival.  Assisted pt to EOB and pt stood with min assist to be cleaned of stool.  Pt with very poor safety awareness overall.  Difficulty with following commands.  Once bed cleaned, pt was placed back in bed and lunch was in room therefore set pt up to eat and pt took a while to cue to initiate eating and feeding herself.  Feel that SNF would be best for pt as she does not appear to have capacity to care for herself as NT has cleaned her several times today.  Pt will benefit from skilled PT to increase their independence and safety with mobility to allow discharge to the venue listed below.      Follow Up Recommendations SNF;Supervision/Assistance - 24 hour    Equipment Recommendations  Other (comment)(TBA)    Recommendations for Other Services       Precautions / Restrictions Precautions Precautions: Fall Precaution Comments: hx dementia Restrictions Weight Bearing Restrictions: No      Mobility  Bed Mobility Overal bed mobility: Modified Independent             General bed mobility comments:  EOB with visual and verbal cues,   Transfers Overall transfer level: Needs  assistance Equipment used: Rolling walker (2 wheeled) Transfers: Sit to/from Stand Sit to Stand: Min guard         General transfer comment: physical cues/prompts and gestures required to initiate tasks.  Pt lying in large diarrhea.  Assisted to EOB and stood to clean pt with pt holding onto back of chair.  Had to change all bed sheets and gown.  Cleaned pt thoroughly.   Ambulation/Gait                Stairs            Wheelchair Mobility    Modified Rankin (Stroke Patients Only)       Balance Overall balance assessment: Needs assistance Sitting-balance support: No upper extremity supported;Feet supported Sitting balance-Leahy Scale: Fair     Standing balance support: Bilateral upper extremity supported;During functional activity Standing balance-Leahy Scale: Poor Standing balance comment: requires UE support for balance.                              Pertinent Vitals/Pain Pain Assessment: No/denies pain    Home Living Family/patient expects to be discharged to:: Unsure                 Additional Comments: pt with advanced dementia, unable to provide any (reliable) information and no family/caregivers present to provide any information    Prior Function Level of Independence: Needs assistance  Comments: Per previous note, she is able to ambulate without assistive device at baseline but she requires assistance with eating, toileting, dressing and bathing.      Hand Dominance   Dominant Hand: Right    Extremity/Trunk Assessment   Upper Extremity Assessment Upper Extremity Assessment: Generalized weakness    Lower Extremity Assessment Lower Extremity Assessment: Generalized weakness    Cervical / Trunk Assessment Cervical / Trunk Assessment: Normal  Communication   Communication: Expressive difficulties;Receptive difficulties(Pt hums, grunts and will at times say yes/no;ques reliabilit)  Cognition Arousal/Alertness:  Awake/alert Behavior During Therapy: Anxious Overall Cognitive Status: History of cognitive impairments - at baseline                                 General Comments: pt nonverbal and says "ahhh" and giggling with all questioning and interaction, occasional nodding appropriately to questions.       General Comments      Exercises     Assessment/Plan    PT Assessment Patient needs continued PT services  PT Problem List Decreased activity tolerance;Decreased mobility;Decreased safety awareness;Cardiopulmonary status limiting activity       PT Treatment Interventions Gait training;DME instruction;Functional mobility training;Therapeutic activities;Therapeutic exercise;Balance training;Cognitive remediation;Patient/family education    PT Goals (Current goals can be found in the Care Plan section)  Acute Rehab PT Goals Patient Stated Goal: none stated PT Goal Formulation: Patient unable to participate in goal setting Time For Goal Achievement: 03/28/18 Potential to Achieve Goals: Fair    Frequency Min 2X/week   Barriers to discharge Decreased caregiver support      Co-evaluation               AM-PAC PT "6 Clicks" Daily Activity  Outcome Measure Difficulty turning over in bed (including adjusting bedclothes, sheets and blankets)?: Unable Difficulty moving from lying on back to sitting on the side of the bed? : A Little Difficulty sitting down on and standing up from a chair with arms (e.g., wheelchair, bedside commode, etc,.)?: A Little Help needed moving to and from a bed to chair (including a wheelchair)?: A Little Help needed walking in hospital room?: Total Help needed climbing 3-5 steps with a railing? : Total 6 Click Score: 12    End of Session Equipment Utilized During Treatment: Gait belt Activity Tolerance: Patient tolerated treatment well Patient left: in bed;with call bell/phone within reach;with bed alarm set Nurse Communication: Mobility  status PT Visit Diagnosis: Unsteadiness on feet (R26.81);Other abnormalities of gait and mobility (R26.89);Muscle weakness (generalized) (M62.81);Difficulty in walking, not elsewhere classified (R26.2)    Time: 1335-1400 PT Time Calculation (min) (ACUTE ONLY): 25 min   Charges:   PT Evaluation $PT Eval Moderate Complexity: 1 Mod PT Treatments $Therapeutic Activity: 8-22 mins   PT G Codes:        Emily Curtis,PT Acute Rehabilitation [phone]  Berline Lopes 03/14/2018, 2:40 PM

## 2018-03-15 DIAGNOSIS — D649 Anemia, unspecified: Secondary | ICD-10-CM

## 2018-03-15 LAB — STREP PNEUMONIAE URINARY ANTIGEN: Strep Pneumo Urinary Antigen: NEGATIVE

## 2018-03-15 NOTE — Progress Notes (Signed)
PROGRESS NOTE    Emily Curtis  ZOX:[id number] DOB: [birth year] DOA: 03/13/2018 PCP: [place], Betty G, MD   Outpatient Specialists:     Brief Narrative:  Emily Curtis is a 67 y.o. female with medical history significant of dementia (almost nonverbal, eats, drinks, and paces).  Family reported patient to be less responsive than usual. Pt ate breakfast and had a small amount of vomiting. Pt was in the car for an extended period of time and may have become hot per nephew.   She was admitted from 4/10-12 for probable vasovagal syncope with anemia, hgb 7.1 s/p 1 unit PRBC.  Chest x ray was indicative for RML thought to be aspiration       Assessment & Plan:   Principal Problem:   Aspiration pneumonia (HCC) Active Problems:   Alzheimer's dementia with behavioral disturbance   Anemia   Goals of care, counseling/discussion   Palliative care by specialist   Aspiration pneumonia -PCN allergy-- was given levaquin in the ER-- will continue for now and monitor for improvement -blood cultures have shown no growth -seen by speech and given regular diet with no signs of aspiration  Severe alzheimer's dementia -palliative care following, not felt to be a candidate for hospice at this time. -3-4 recent ER/hospitalizations within past 6 months -most present with hypotension from poor PO intake -patient virtually non-verbal  Nursing reports patient's fingers and toenails had dried stool under as well as stool on her chest.  Last admission, SNF was recommended but patient had no insurance-- family is trying to get medicaid/medicare.  Family seems overwhelmed with her needs and is doing the best they can. Social work following and helping with placement.  DVT prophylaxis:  Lovenox   Code Status: DNR   Family Communication: Discussed with family member at the bedside  Disposition Plan:  Social work is working on possible placement   Consultants:  Palliative  care  Subjective: Speech is not coherent. Had one bowel movement today.   Objective: Vitals:   03/14/18 0444 03/14/18 2151 03/15/18 0628 03/15/18 1525  BP: 140/80 134/78 (!) 141/78 127/86  Pulse: (!) 116 98 99 100  Resp: 16 18 (!) 173 16  Temp: (!) 100.5 F (38.1 C) 99.3 F (37.4 C) 99 F (37.2 C) 99.5 F (37.5 C)  TempSrc: Oral   Oral  SpO2: 95% 95% 96% 97%  Weight:        Intake/Output Summary (Last 24 hours) at 03/15/2018 1946 Last data filed at 03/15/2018 1651 Gross per 24 hour  Intake 290 ml  Output 525 ml  Net -235 ml   Filed Weights   03/13/18 2254  Weight: 76.5 kg (168 lb 10.4 oz)    Examination:  General exam: Alert, awake, no distress Respiratory system: bilateral rhonchi with mild wheeze bilaterally. Respiratory effort normal. Cardiovascular system:RRR. No murmurs, rubs, gallops. Gastrointestinal system: Abdomen is nondistended, soft and nontender. No organomegaly or masses felt. Normal bowel sounds heard. Central nervous system: No focal neurological deficits. Extremities: No C/C/E, +pedal pulses Skin: No rashes, lesions or ulcers Psychiatry: does not answer questions, nonverbal   Data Reviewed: I have personally reviewed following labs and imaging studies  CBC: Recent Labs  Lab 03/13/18 1249 03/14/18 0515  WBC 11.2* 8.6  NEUTROABS 9.7* 7.4  HGB 10.6* 10.8*  HCT 33.0* 33.7*  MCV 82.1 80.8  PLT 219 207   Basic Metabolic Panel: Recent Labs  Lab 03/13/18 1249 03/14/18 0515  NA 139 134*  K 4.3 3.7  CL 110 107  CO2 18* 17*  GLUCOSE 101* 122*  BUN 10 9  CREATININE 0.98 0.75  CALCIUM 8.3* 8.2*   GFR: Estimated Creatinine Clearance: 64.9 mL/min (by C-G formula based on SCr of 0.75 mg/dL). Liver Function Tests: Recent Labs  Lab 03/13/18 1249  AST 20  ALT 10*  ALKPHOS 100  BILITOT 0.5  PROT 6.5  ALBUMIN 3.4*   No results for input(s): LIPASE, AMYLASE in the last 168 hours. No results for input(s): AMMONIA in the last 168  hours. Coagulation Profile: No results for input(s): INR, PROTIME in the last 168 hours. Cardiac Enzymes: Recent Labs  Lab 03/13/18 1249  TROPONINI <0.03   BNP (last 3 results) No results for input(s): PROBNP in the last 8760 hours. HbA1C: No results for input(s): HGBA1C in the last 72 hours. CBG: No results for input(s): GLUCAP in the last 168 hours. Lipid Profile: No results for input(s): CHOL, HDL, LDLCALC, TRIG, CHOLHDL, LDLDIRECT in the last 72 hours. Thyroid Function Tests: No results for input(s): TSH, T4TOTAL, FREET4, T3FREE, THYROIDAB in the last 72 hours. Anemia Panel: No results for input(s): VITAMINB12, FOLATE, FERRITIN, TIBC, IRON, RETICCTPCT in the last 72 hours. Urine analysis:    Component Value Date/Time   COLORURINE YELLOW 03/05/2018 2055   APPEARANCEUR HAZY (A) 03/05/2018 2055   LABSPEC 1.023 03/05/2018 2055   PHURINE 5.0 03/05/2018 2055   GLUCOSEU NEGATIVE 03/05/2018 2055   HGBUR NEGATIVE 03/05/2018 2055   BILIRUBINUR NEGATIVE 03/05/2018 2055   KETONESUR NEGATIVE 03/05/2018 2055   PROTEINUR 30 (A) 03/05/2018 2055   NITRITE NEGATIVE 03/05/2018 2055   LEUKOCYTESUR NEGATIVE 03/05/2018 2055     ) Recent Results (from the past 240 hour(s))  Blood culture (routine x 2)     Status: None (Preliminary result)   Collection Time: 03/13/18  6:00 PM  Result Value Ref Range Status   Specimen Description BLOOD RIGHT HAND  Final   Special Requests   Final    BOTTLES DRAWN AEROBIC AND ANAEROBIC Blood Culture adequate volume   Culture   Final    NO GROWTH 2 DAYS Performed at [facility], [address] [place], [place] [address]    Report Status PENDING  Incomplete  Blood culture (routine x 2)     Status: None (Preliminary result)   Collection Time: 03/13/18  6:20 PM  Result Value Ref Range Status   Specimen Description BLOOD LEFT HAND  Final   Special Requests   Final    BOTTLES DRAWN AEROBIC AND ANAEROBIC Blood Culture adequate volume   Culture    Final    NO GROWTH 2 DAYS Performed at [facility], [address] [place], [place] [address]    Report Status PENDING  Incomplete      Anti-infectives (From admission, onward)   Start     Dose/Rate Route Frequency Ordered Stop   03/13/18 1745  levofloxacin (LEVAQUIN) IVPB 750 mg     750 mg 100 mL/hr over 90 Minutes Intravenous Every 24 hours 03/13/18 1732 03/18/18 1744   03/13/18 1615  vancomycin (VANCOCIN) 1,500 mg in sodium chloride 0.9 % 500 mL IVPB  Status:  Discontinued     1,500 mg 250 mL/hr over 120 Minutes Intravenous  Once 03/13/18 1610 03/13/18 1643       Radiology Studies: No results found.      Scheduled Meds: . donepezil  10 mg Oral QHS  . enoxaparin (LOVENOX) injection  40 mg Subcutaneous Q24H  . famotidine  20 mg Oral BID  . loratadine  10 mg Oral Daily  . memantine  10 mg Oral BID  . pantoprazole  20 mg Oral Daily  . vitamin B-12  1,000 mcg Oral Daily   Continuous Infusions: . sodium chloride 50 mL/hr at 03/14/18 2308  . levofloxacin (LEVAQUIN) IV Stopped (03/15/18 1754)     LOS: 1 day    Time spent: 25 min    Erick BlinksJehanzeb Teona Vargus, MD [facility] Pager [phone]  If 7PM-7AM, please contact night-coverage [email or web address] Password [email or web address] 03/15/2018, 7:46 PM

## 2018-03-16 LAB — BASIC METABOLIC PANEL
Anion gap: 9 (ref 5–15)
BUN: 8 mg/dL (ref 6–20)
CALCIUM: 8.2 mg/dL — AB (ref 8.9–10.3)
CO2: 20 mmol/L — AB (ref 22–32)
CREATININE: 0.63 mg/dL (ref 0.44–1.00)
Chloride: 105 mmol/L (ref 101–111)
GFR calc Af Amer: >60 mL/min (ref >60)
GFR calc non Af Amer: >60 mL/min (ref >60)
Glucose, Bld: 113 mg/dL — ABNORMAL HIGH (ref 65–99)
Potassium: 3.5 mmol/L (ref 3.5–5.1)
Sodium: 134 mmol/L — ABNORMAL LOW (ref 135–145)

## 2018-03-16 LAB — CBC
HCT: 34.2 % — ABNORMAL LOW (ref 36.0–46.0)
Hemoglobin: 11.1 g/dL — ABNORMAL LOW (ref 12.0–15.0)
MCH: 25.9 pg — AB (ref 26.0–34.0)
MCHC: 32.5 g/dL (ref 30.0–36.0)
MCV: 79.7 fL (ref 78.0–100.0)
Platelets: 206 10*3/uL (ref 150–400)
RBC: 4.29 MIL/uL (ref 3.87–5.11)
RDW: 16.4 % — AB (ref 11.5–15.5)
WBC: 8.6 10*3/uL (ref 4.0–10.5)

## 2018-03-16 MED ORDER — LEVOFLOXACIN 750 MG PO TABS
750.0000 mg | ORAL_TABLET | Freq: Every day | ORAL | Status: AC
  Administered 2018-03-16 – 2018-03-17 (×2): 750 mg via ORAL
  Filled 2018-03-16 (×2): qty 1

## 2018-03-16 NOTE — Progress Notes (Signed)
PROGRESS NOTE    Emily Curtis  ZOX:[id number] DOB: March 23, 1951 DOA: 03/13/2018 PCP: [place], Betty G, MD   Outpatient Specialists:     Brief Narrative:  Emily Curtis is a 67 y.o. female with medical history significant of dementia (almost nonverbal, eats, drinks, and paces).  Family reported patient to be less responsive than usual. Pt ate breakfast and had a small amount of vomiting. Pt was in the car for an extended period of time and may have become hot per nephew.   She was last admitted from 4/10-12 for probable vasovagal syncope with anemia, hgb 7.1 s/p 1 unit PRBC.  Chest x ray was indicative for RML thought to be aspiration    Her current admission is also for pneumonia, felt to be related to aspiration.  Seen by speech therapy and no significant change in diet was recommended.  She is currently on antibiotics.  Family appears to be having a difficult time providing adequate care at home.  Lack of insurance is also made this challenging.  Social work is working with [facility] to see if there is a possibility that the patient can be placed in a nursing home.     Assessment & Plan:   Principal Problem:   Aspiration pneumonia (HCC) Active Problems:   Alzheimer's dementia with behavioral disturbance   Anemia   Goals of care, counseling/discussion   Palliative care by specialist   Aspiration pneumonia -PCN allergy--patient started on Levaquin in the emergency room.  This was continued on admission.  Clinically she appears to be improving. -blood cultures have shown no growth -seen by speech and given regular diet with no signs of aspiration  Severe alzheimer's dementia -palliative care following, not felt to be a candidate for hospice at this time. -3-4 recent ER/hospitalizations within past 6 months -most present with hypotension from poor PO intake -patient virtually non-verbal  Social: Nursing reports patient's fingers and toenails had dried stool  under as well as stool on her chest.  Last admission, SNF was recommended but patient had no insurance-- family is trying to get medicaid/medicare.  Family seems overwhelmed with her needs and is doing the best they can.  Social work trying to have patient placed in a nursing home.  Working with [facility] to see if there is a possibility.  DVT prophylaxis:  Lovenox   Code Status: DNR   Family Communication: No family present today  Disposition Plan: Does not appear the patient's family can provide adequate care at home.  Social work is working with [facility] to see if placement is a possibility   Consultants:  Palliative care  Subjective: Nonverbal, awake, does not answer questions  Objective: Vitals:   03/15/18 1525 03/15/18 2117 03/16/18 0533 03/16/18 1430  BP: 127/86 132/87 (!) 161/91 113/66  Pulse: 100 98 80 89  Resp: 16 15 18    Temp: 99.5 F (37.5 C) 98.6 F (37 C) 98.4 F (36.9 C) 98.4 F (36.9 C)  TempSrc: Oral Oral Oral Oral  SpO2: 97% 97% 99% 98%  Weight:        Intake/Output Summary (Last 24 hours) at 03/16/2018 1647 Last data filed at 03/16/2018 0601 Gross per 24 hour  Intake 715.83 ml  Output 725 ml  Net -9.17 ml   Filed Weights   03/13/18 2254  Weight: 76.5 kg (168 lb 10.4 oz)    Examination:  General exam: no distress Respiratory system: Clear to auscultation. Respiratory effort normal. Cardiovascular system:RRR. No murmurs, rubs, gallops.  Gastrointestinal system: Abdomen is nondistended, soft and nontender. No organomegaly or masses felt. Normal bowel sounds heard. Central nervous system: No focal neurological deficits. Extremities: No C/C/E, +pedal pulses Skin: No rashes, lesions or ulcers Psychiatry: nonverbal  Data Reviewed: I have personally reviewed following labs and imaging studies  CBC: Recent Labs  Lab 03/13/18 1249 03/14/18 0515 03/16/18 0907  WBC 11.2* 8.6 8.6  NEUTROABS 9.7* 7.4  --   HGB 10.6* 10.8*  11.1*  HCT 33.0* 33.7* 34.2*  MCV 82.1 80.8 79.7  PLT 219 207 206   Basic Metabolic Panel: Recent Labs  Lab 03/13/18 1249 03/14/18 0515 03/16/18 0907  NA 139 134* 134*  K 4.3 3.7 3.5  CL 110 107 105  CO2 18* 17* 20*  GLUCOSE 101* 122* 113*  BUN 10 9 8   CREATININE 0.98 0.75 0.63  CALCIUM 8.3* 8.2* 8.2*   GFR: Estimated Creatinine Clearance: 64.9 mL/min (by C-G formula based on SCr of 0.63 mg/dL). Liver Function Tests: Recent Labs  Lab 03/13/18 1249  AST 20  ALT 10*  ALKPHOS 100  BILITOT 0.5  PROT 6.5  ALBUMIN 3.4*   No results for input(s): LIPASE, AMYLASE in the last 168 hours. No results for input(s): AMMONIA in the last 168 hours. Coagulation Profile: No results for input(s): INR, PROTIME in the last 168 hours. Cardiac Enzymes: Recent Labs  Lab 03/13/18 1249  TROPONINI <0.03   BNP (last 3 results) No results for input(s): PROBNP in the last 8760 hours. HbA1C: No results for input(s): HGBA1C in the last 72 hours. CBG: No results for input(s): GLUCAP in the last 168 hours. Lipid Profile: No results for input(s): CHOL, HDL, LDLCALC, TRIG, CHOLHDL, LDLDIRECT in the last 72 hours. Thyroid Function Tests: No results for input(s): TSH, T4TOTAL, FREET4, T3FREE, THYROIDAB in the last 72 hours. Anemia Panel: No results for input(s): VITAMINB12, FOLATE, FERRITIN, TIBC, IRON, RETICCTPCT in the last 72 hours. Urine analysis:    Component Value Date/Time   COLORURINE YELLOW 03/05/2018 2055   APPEARANCEUR HAZY (A) 03/05/2018 2055   LABSPEC 1.023 03/05/2018 2055   PHURINE 5.0 03/05/2018 2055   GLUCOSEU NEGATIVE 03/05/2018 2055   HGBUR NEGATIVE 03/05/2018 2055   BILIRUBINUR NEGATIVE 03/05/2018 2055   KETONESUR NEGATIVE 03/05/2018 2055   PROTEINUR 30 (A) 03/05/2018 2055   NITRITE NEGATIVE 03/05/2018 2055   LEUKOCYTESUR NEGATIVE 03/05/2018 2055     ) Recent Results (from the past 240 hour(s))  Blood culture (routine x 2)     Status: None (Preliminary result)     Collection Time: 03/13/18  6:00 PM  Result Value Ref Range Status   Specimen Description BLOOD RIGHT HAND  Final   Special Requests   Final    BOTTLES DRAWN AEROBIC AND ANAEROBIC Blood Culture adequate volume   Culture   Final    NO GROWTH 3 DAYS Performed at [facility], [address] [place], [place] [address]    Report Status PENDING  Incomplete  Blood culture (routine x 2)     Status: None (Preliminary result)   Collection Time: 03/13/18  6:20 PM  Result Value Ref Range Status   Specimen Description BLOOD LEFT HAND  Final   Special Requests   Final    BOTTLES DRAWN AEROBIC AND ANAEROBIC Blood Culture adequate volume   Culture   Final    NO GROWTH 3 DAYS Performed at [facility], [address], [place], [place] [address]    Report Status PENDING  Incomplete  Anti-infectives (From admission, onward)   Start     Dose/Rate Route Frequency Ordered Stop   03/16/18 1800  levofloxacin (LEVAQUIN) tablet 750 mg     750 mg Oral Daily-1800 03/16/18 1449 03/18/18 1759   03/13/18 1745  levofloxacin (LEVAQUIN) IVPB 750 mg  Status:  Discontinued     750 mg 100 mL/hr over 90 Minutes Intravenous Every 24 hours 03/13/18 1732 03/16/18 1449   03/13/18 1615  vancomycin (VANCOCIN) 1,500 mg in sodium chloride 0.9 % 500 mL IVPB  Status:  Discontinued     1,500 mg 250 mL/hr over 120 Minutes Intravenous  Once 03/13/18 1610 03/13/18 1643       Radiology Studies: No results found.      Scheduled Meds: . donepezil  10 mg Oral QHS  . enoxaparin (LOVENOX) injection  40 mg Subcutaneous Q24H  . famotidine  20 mg Oral BID  . levofloxacin  750 mg Oral q1800  . loratadine  10 mg Oral Daily  . memantine  10 mg Oral BID  . pantoprazole  20 mg Oral Daily  . vitamin B-12  1,000 mcg Oral Daily   Continuous Infusions: . sodium chloride 50 mL/hr at 03/15/18 2115     LOS: 2 days    Time spent: 25 min    Erick BlinksJehanzeb Mannie Wineland, MD [facility] Pager [phone]  If  7PM-7AM, please contact night-coverage [email or web address] Password [email or web address] 03/16/2018, 4:47 PM

## 2018-03-17 MED ORDER — SODIUM CHLORIDE 0.9 % IV SOLN
INTRAVENOUS | Status: AC
  Administered 2018-03-17: 14:00:00 via INTRAVENOUS

## 2018-03-17 NOTE — Progress Notes (Signed)
[facility] PROGRESS NOTE  Emily Curtis ZOX:[id number] DOB: 04/04/1951 DOA: 03/13/2018 PCP: [place], Betty G, MD  Brief summary   67 y.o.femalewith medical history significant ofdementia (almost nonverbal, eats, drinks, and paces). Family reported patient to be less responsive than usual. Pt ate breakfast and had a small amount of vomiting. Pt was in the car for an extended period of time and may have become hot per nephew.   She was last admitted from 4/10-12 for probable vasovagal syncope with anemia, hgb 7.1 s/p 1 unit PRBC.Chest x ray was indicative for RML thought to be aspiration   Her current admission is also for pneumonia, felt to be related to aspiration.  Seen by speech therapy and no significant change in diet was recommended.  She is currently on antibiotics.  Family appears to be having a difficult time providing adequate care at home.  Lack of insurance is also made this challenging.  Social work is working with Artist to see if there is a possibility that the patient can be placed in a nursing home.   Assessment/Plan:  Aspiration pneumonia. PCN allergy--patient started on Levaquin in the emergency room.  This was continued on admission.  Clinically she appears to be improving. blood cultures have shown no growth. seen by speech and given regular diet with no signs of aspiration  Severe alzheimer's dementia. palliative care following, not felt to be a candidate for hospice at this time. 3-4 recent ER/hospitalizations within past 6 months. most present with hypotension from poor PO intake. patient virtually non-verbal  Social: Nursing reports patient's fingers and toenails had dried stool under as well as stool on her chest.  Last admission, SNF was recommended but patient had no insurance-- family is trying to get medicaid/medicare.  Family seems overwhelmed with her needs and is doing the best they can. Social work trying to have patient placed in a  nursing home.  Working with Artist to see if there is a possibility.   Code Status: DNR Family Communication: d/w patient, RN (indicate person spoken with, relationship, and if by phone, the number) Disposition Plan: needs SNF   Consultants:  Palliative care  Procedures:  none  Antibiotics: Anti-infectives (From admission, onward)   Start     Dose/Rate Route Frequency Ordered Stop   03/16/18 1800  levofloxacin (LEVAQUIN) tablet 750 mg     750 mg Oral Daily-1800 03/16/18 1449 03/18/18 1759   03/13/18 1745  levofloxacin (LEVAQUIN) IVPB 750 mg  Status:  Discontinued     750 mg 100 mL/hr over 90 Minutes Intravenous Every 24 hours 03/13/18 1732 03/16/18 1449   03/13/18 1615  vancomycin (VANCOCIN) 1,500 mg in sodium chloride 0.9 % 500 mL IVPB  Status:  Discontinued     1,500 mg 250 mL/hr over 120 Minutes Intravenous  Once 03/13/18 1610 03/13/18 1643        (indicate start date, and stop date if known)  HPI/Subjective: No distress. Follows commands. Afebrile   Objective: Vitals:   03/16/18 2023 03/17/18 0532  BP: 117/69 103/63  Pulse: 89 70  Resp: 16 16  Temp: 98.5 F (36.9 C) 98 F (36.7 C)  SpO2: 96% 96%    Intake/Output Summary (Last 24 hours) at 03/17/2018 1307 Last data filed at 03/17/2018 0551 Gross per 24 hour  Intake 948.33 ml  Output 500 ml  Net 448.33 ml   Filed Weights   03/13/18 2254  Weight: 76.5 kg (168 lb 10.4 oz)    Exam:  General:  No distress   Cardiovascular: s1,s2 rrr  Respiratory: CTA BL  Abdomen: soft, nt   Musculoskeletal: no leg edema    Data Reviewed: Basic Metabolic Panel: Recent Labs  Lab 03/13/18 1249 03/14/18 0515 03/16/18 0907  NA 139 134* 134*  K 4.3 3.7 3.5  CL 110 107 105  CO2 18* 17* 20*  GLUCOSE 101* 122* 113*  BUN 10 9 8   CREATININE 0.98 0.75 0.63  CALCIUM 8.3* 8.2* 8.2*   Liver Function Tests: Recent Labs  Lab 03/13/18 1249  AST 20  ALT 10*  ALKPHOS 100  BILITOT 0.5  PROT 6.5   ALBUMIN 3.4*   No results for input(s): LIPASE, AMYLASE in the last 168 hours. No results for input(s): AMMONIA in the last 168 hours. CBC: Recent Labs  Lab 03/13/18 1249 03/14/18 0515 03/16/18 0907  WBC 11.2* 8.6 8.6  NEUTROABS 9.7* 7.4  --   HGB 10.6* 10.8* 11.1*  HCT 33.0* 33.7* 34.2*  MCV 82.1 80.8 79.7  PLT 219 207 206   Cardiac Enzymes: Recent Labs  Lab 03/13/18 1249  TROPONINI <0.03   BNP (last 3 results) No results for input(s): BNP in the last 8760 hours.  ProBNP (last 3 results) No results for input(s): PROBNP in the last 8760 hours.  CBG: No results for input(s): GLUCAP in the last 168 hours.  Recent Results (from the past 240 hour(s))  Blood culture (routine x 2)     Status: None (Preliminary result)   Collection Time: 03/13/18  6:00 PM  Result Value Ref Range Status   Specimen Description BLOOD RIGHT HAND  Final   Special Requests   Final    BOTTLES DRAWN AEROBIC AND ANAEROBIC Blood Culture adequate volume   Culture   Final    NO GROWTH 4 DAYS Performed at [facility], [address] [place], [place] [address]    Report Status PENDING  Incomplete  Blood culture (routine x 2)     Status: None (Preliminary result)   Collection Time: 03/13/18  6:20 PM  Result Value Ref Range Status   Specimen Description BLOOD LEFT HAND  Final   Special Requests   Final    BOTTLES DRAWN AEROBIC AND ANAEROBIC Blood Culture adequate volume   Culture   Final    NO GROWTH 4 DAYS Performed at [facility], [address] [place], [place] [address]    Report Status PENDING  Incomplete     Studies: No results found.  Scheduled Meds: . donepezil  10 mg Oral QHS  . enoxaparin (LOVENOX) injection  40 mg Subcutaneous Q24H  . famotidine  20 mg Oral BID  . levofloxacin  750 mg Oral q1800  . loratadine  10 mg Oral Daily  . memantine  10 mg Oral BID  . pantoprazole  20 mg Oral Daily  . vitamin B-12  1,000 mcg Oral Daily   Continuous Infusions: .  sodium chloride 50 mL/hr at 03/16/18 2154    Principal Problem:   Aspiration pneumonia [facility](HCC) Active Problems:   Alzheimer's dementia with behavioral disturbance   Anemia   Goals of care, counseling/discussion   Palliative care by specialist    Time spent: >35 minutes     Esperanza SheetsBURIEV, Jocelyne Reinertsen N  [facility] Pager [phone]. If 7PM-7AM, please contact night-coverage at [email or web address], password [email or web address] 03/17/2018, 1:07 PM  LOS: 3 days

## 2018-03-17 NOTE — Social Work (Signed)
CSW discussed case with assistant director Wandra MannanZack Brooks, per CSW dept.pt is custodial in nature of care, we would most likely be unable to offer LOG for SNF stay. Unless pt family able to private pay (per last hospitalization they are unable to do so), we would not be able to place pt.   Continuing to follow for PT/OT evaluations for continued understanding of pt needs/limitations, pt did not ambulate during last PT eval due to bowel incontinence.   CSW continuing to follow.  Doy HutchingIsabel H Martin Belling, LCSWA [facility] Clinical Social Work [phone]

## 2018-03-18 DIAGNOSIS — J690 Pneumonitis due to inhalation of food and vomit: Principal | ICD-10-CM

## 2018-03-18 LAB — CULTURE, BLOOD (ROUTINE X 2)
Culture: NO GROWTH
Culture: NO GROWTH
SPECIAL REQUESTS: ADEQUATE
Special Requests: ADEQUATE

## 2018-03-18 MED ORDER — LEVOFLOXACIN 750 MG PO TABS: 750.0000 mg | ORAL_TABLET | Freq: Every day | ORAL | 0 refills | Status: DC

## 2018-03-18 MED ORDER — LEVOFLOXACIN 750 MG PO TABS: 750.0000 mg | ORAL_TABLET | Freq: Every day | ORAL | Status: DC

## 2018-03-18 MED ORDER — LEVOFLOXACIN 750 MG PO TABS: 750.0000 mg | ORAL_TABLET | Freq: Every day | ORAL | 0 refills | Status: AC

## 2018-03-18 NOTE — Progress Notes (Addendum)
Physical Therapy Treatment Patient Details Name: Emily Curtis MRN: [id number] DOB: 05/10/1951 Today's Date: 03/18/2018    History of Present Illness Emily GreenhouseCathy Curtis Hastyis a 67 y.o.femalewith medical history significant ofdementia (almost nonverbal, eats, drinks, and paces). Family reported patient to be less responsive than usual. Pt ate breakfast and had a small amount of vomiting. Pt was in the car for an extended period of time and may have become hot per nephew. She was admitted from 4/10-12 for probable vasovagal syncope with anemia, hgb 7.1 s/p 1 unit PRBC.  Chest x ray was indicative for RML thought to be aspiration.      PT Comments    Pt making progress towards her goals, however is limited in safe mobility by decreased safety awareness and cognition. Pt requires min guard with increased cuing with transfers and limited community level ambulation to maintain safety. PT continues to recommend SNF level care given pt's advanced dementia and decreased safety awareness to insure safe mobilization in her environment.     Follow Up Recommendations  SNF;Supervision/Assistance - 24 hour     Equipment Recommendations  Other (comment)(TBA)       Precautions / Restrictions Precautions Precautions: Fall Precaution Comments: hx dementia Restrictions Weight Bearing Restrictions: No    Mobility  Bed Mobility Overal bed mobility: Modified Independent             General bed mobility comments:  EOB with visual and verbal cues,   Transfers Overall transfer level: Needs assistance Equipment used: Rolling walker (2 wheeled) Transfers: Sit to/from Stand Sit to Stand: Min guard         General transfer comment: min guard for safety, verbal, visual and physical cues for sit>stand from the bed and the toilet    Ambulation/Gait Ambulation/Gait assistance: Min guard Ambulation Distance (Feet): 80 Feet Assistive device: None Gait Pattern/deviations: Step-through  pattern;Shuffle Gait velocity: slowed Gait velocity interpretation: 1.31 - 2.62 ft/sec, indicative of limited community ambulator General Gait Details: min guard for safety, pt with decreased knowledge of usage of AD so did not attempt use, pt steady enough without AD        Balance Overall balance assessment: Needs assistance Sitting-balance support: No upper extremity supported;Feet supported Sitting balance-Leahy Scale: Fair     Standing balance support: During functional activity;No upper extremity supported Standing balance-Leahy Scale: Fair Standing balance comment: can not withstand increased perturbation                             Cognition Arousal/Alertness: Awake/alert Behavior During Therapy: Anxious Overall Cognitive Status: History of cognitive impairments - at baseline                                 General Comments: pt nonverbal and says "ahhh" and giggling with all questioning and interaction, occasional nodding appropriately to questions.              Pertinent Vitals/Pain Pain Assessment: Faces Faces Pain Scale: No hurt     PT Goals (current goals can now be found in the care plan section) Acute Rehab PT Goals Patient Stated Goal: none stated PT Goal Formulation: Patient unable to participate in goal setting Time For Goal Achievement: 03/28/18 Potential to Achieve Goals: Fair Progress towards PT goals: Progressing toward goals    Frequency    Min 2X/week      PT Plan Current plan  remains appropriate       AM-PAC PT "6 Clicks" Daily Activity  Outcome Measure  Difficulty turning over in bed (including adjusting bedclothes, sheets and blankets)?: A Little Difficulty moving from lying on back to sitting on the side of the bed? : A Little Difficulty sitting down on and standing up from a chair with arms (e.g., wheelchair, bedside commode, etc,.)?: A Little Help needed moving to and from a bed to chair (including a  wheelchair)?: A Little Help needed walking in hospital room?: A Little Help needed climbing 3-5 steps with a railing? : A Little 6 Click Score: 18    End of Session Equipment Utilized During Treatment: Gait belt Activity Tolerance: Patient tolerated treatment well Patient left: in bed;with call bell/phone within reach;with bed alarm set Nurse Communication: Mobility status PT Visit Diagnosis: Unsteadiness on feet (R26.81);Other abnormalities of gait and mobility (R26.89);Muscle weakness (generalized) (M62.81);Difficulty in walking, not elsewhere classified (R26.2)     Time: 1610-9604 PT Time Calculation (min) (ACUTE ONLY): 28 min  Charges:  $Gait Training: 8-22 mins                    G Codes:       Emily Curtis Emily Curtis PT, DPT Acute Rehabilitation  [phone] Pager [phone]     Emily Curtis 03/18/2018, 1:54 PM

## 2018-03-18 NOTE — Evaluation (Signed)
Occupational Therapy Evaluation Patient Details Name: Emily CaffeyCathy Curtis Milo MRN: [id number] DOB: 09/21/1951 Today's Date: 03/18/2018    History of Present Illness Emily GreenhouseCathy Curtis Hastyis a 67 y.o.femalewith medical history significant ofdementia (almost nonverbal, eats, drinks, and paces). Family reported patient to be less responsive than usual. Pt ate breakfast and had a small amount of vomiting. Pt was in the car for an extended period of time and may have become hot per nephew. She was admitted from 4/10-12 for probable vasovagal syncope with anemia, hgb 7.1 s/p 1 unit PRBC.  Chest x-ray was indicative for pneumonia thought to be aspiration.     Clinical Impression   Per chart, pt was able to ambulate without assistive device but required assistance for ADL due to significant cognitive deficits. Pt currently requires gestural and simple verbal cues to complete each step of familiar activities and did require hands on assistance to wash her hands. She is able to feed herself but requires cues to use utensils and avoid taking large, unsafe bites. Pt requiring max assist for LB ADL and min assist for UB ADL as well as min guard assist for toilet transfers. She would benefit from SNF placement post-acute D/C to maximize safety during ADL participation. OT will continue to follow while admitted to maximize safety and engagement with ADL as well as for family education. Please see general comments concerning skin concerns found on OT/PT arrival.    Follow Up Recommendations  SNF;Supervision/Assistance - 24 hour    Equipment Recommendations  None recommended by OT    Recommendations for Other Services       Precautions / Restrictions Precautions Precautions: Fall Precaution Comments: hx dementia Restrictions Weight Bearing Restrictions: No      Mobility Bed Mobility Overal bed mobility: Modified Independent             General bed mobility comments:  EOB with visual and verbal cues,    Transfers Overall transfer level: Needs assistance Equipment used: Rolling walker (2 wheeled) Transfers: Sit to/from Stand Sit to Stand: Min guard         General transfer comment: min guard for safety, verbal, visual and physical cues for sit>stand from the bed and the toilet      Balance Overall balance assessment: Needs assistance Sitting-balance support: No upper extremity supported;Feet supported Sitting balance-Leahy Scale: Fair     Standing balance support: During functional activity;No upper extremity supported Standing balance-Leahy Scale: Fair Standing balance comment: can not withstand increased perturbation                            ADL either performed or assessed with clinical judgement   ADL Overall ADL's : Needs assistance/impaired Eating/Feeding: Supervision/ safety;Sitting;Cueing for safety Eating/Feeding Details (indicate cue type and reason): cues to prevent shoveling food Grooming: Maximal assistance;Standing Grooming Details (indicate cue type and reason): Required gesture for each step and OT to place hand under soap and turn on water Upper Body Bathing: Minimal assistance;Sitting   Lower Body Bathing: Maximal assistance;Sit to/from stand   Upper Body Dressing : Minimal assistance;Sitting   Lower Body Dressing: Maximal assistance;Sit to/from stand   Toilet Transfer: Min guard;Ambulation Toilet Transfer Details (indicate cue type and reason): requires gestures to indicate where she is to ambulate to Toileting- Clothing Manipulation and Hygiene: Minimal assistance;Sit to/from stand       Functional mobility during ADLs: Min guard General ADL Comments: Pt requiring short, simple verbal cues as well as  gestures for each step of an activity.      Vision   Additional Comments: unable to fully assess due to cognition, able to track therapist around room and make eye contact     Perception     Praxis      Pertinent Vitals/Pain  Pain Assessment: Faces Faces Pain Scale: No hurt Pain Intervention(s): Limited activity within patient's tolerance;Monitored during session;Repositioned     Hand Dominance Right   Extremity/Trunk Assessment Upper Extremity Assessment Upper Extremity Assessment: Generalized weakness   Lower Extremity Assessment Lower Extremity Assessment: Generalized weakness   Cervical / Trunk Assessment Cervical / Trunk Assessment: Normal   Communication Communication Communication: Receptive difficulties;Expressive difficulties   Cognition Arousal/Alertness: Awake/alert Behavior During Therapy: Anxious Overall Cognitive Status: History of cognitive impairments - at baseline                                 General Comments: Pt giggles and says "ahhh" throughout session when addressed. She does occasionally nod appropriately. Benefits from short cues and gestures.   General Comments  Pt in room with nursing student on OT/PT arrival. Student notified therapists that pt on bed pan. OT/PT rolled pt to find bed pan tightly suctioned to sacral region with significant redness resulting. Also noted bruising in similar pattern likely from previously being placed on bed pan for extended period of time. Educated Environmental education officer as well as Nurse, mental health concerning need to ambulate pt to bathroom rather than place on bed pan due to advanced dementia and pt being unfamiliar with this task. At this stage of dementia new leaning is not possible and pt will need highly familiar tasks to be successful.     Exercises     Shoulder Instructions      Home Living Family/patient expects to be discharged to:: Unsure                                 Additional Comments: Pt with advanced dementia and unable to provide information cocnerning home environment. No family present on evaluation.       Prior Functioning/Environment Level of Independence: Needs assistance  Gait / Transfers  Assistance Needed: Per chart, ambulates without device ADL's / Homemaking Assistance Needed: Requires assistance for all ADL per chart.             OT Problem List: Decreased strength;Decreased activity tolerance;Impaired balance (sitting and/or standing);Decreased safety awareness;Decreased knowledge of use of DME or AE;Decreased knowledge of precautions;Decreased cognition      OT Treatment/Interventions: Self-care/ADL training;Therapeutic exercise;Therapeutic activities;Patient/family education    OT Goals(Current goals can be found in the care plan section) Acute Rehab OT Goals Patient Stated Goal: none stated OT Goal Formulation: Patient unable to participate in goal setting Time For Goal Achievement: 04/01/18 Potential to Achieve Goals: Fair ADL Goals Pt Will Perform Eating: with set-up;sitting Pt Will Perform Grooming: with supervision;standing Pt Will Transfer to Toilet: with supervision;bedside commode;ambulating Additional ADL Goal #1: When family gives approrpiate cues, pt will complete UB dressing tasks after set-up with supervision.  OT Frequency: Min 1X/week   Barriers to D/C: Decreased caregiver support          Co-evaluation PT/OT/SLP Co-Evaluation/Treatment: Yes Reason for Co-Treatment: Necessary to address cognition/behavior during functional activity PT goals addressed during session: Mobility/safety with mobility;Balance OT goals addressed during session: ADL's and self-care;Strengthening/ROM  AM-PAC PT "6 Clicks" Daily Activity     Outcome Measure Help from another person eating meals?: A Little Help from another person taking care of personal grooming?: A Little Help from another person toileting, which includes using toliet, bedpan, or urinal?: A Lot Help from another person bathing (including washing, rinsing, drying)?: A Lot Help from another person to put on and taking off regular upper body clothing?: A Little Help from another person to put  on and taking off regular lower body clothing?: A Lot 6 Click Score: 15   End of Session Equipment Utilized During Treatment: Gait belt  Activity Tolerance: Patient tolerated treatment well Patient left: in chair;with call bell/phone within reach;with chair alarm set(with nursing student present)  OT Visit Diagnosis: Cognitive communication deficit (R41.841);Muscle weakness (generalized) (M62.81);Other abnormalities of gait and mobility (R26.89)                Time: 1610-9604 OT Time Calculation (min): 28 min Charges:  OT General Charges $OT Visit: 1 Visit OT Evaluation $OT Eval Moderate Complexity: 1 Mod G-Codes:     Doristine Section, MS OTR/L  Pager: [phone]   Kayloni Rocco A Jacquelyne Quarry 03/18/2018, 2:40 PM

## 2018-03-18 NOTE — Discharge Summary (Signed)
Emily CaffeyCathy Lynch Burstein, is a 67 y.o. female  DOB 01/09/1951  MRN [id number].  Admission date:  03/13/2018  Admitting Physician  Jonah BlueJennifer Yates, MD  Discharge Date:  03/18/2018   Primary MD  [name], Betty G, MD  Recommendations for primary care physician for things to follow:  - please check CBC, BMP, 2 view chest xray during next visit.   Admission Diagnosis  Healthcare-associated pneumonia [J18.9] Altered mental status, unspecified altered mental status type [R41.82] Anemia, unspecified type [D64.9]   Discharge Diagnosis  Healthcare-associated pneumonia [J18.9] Altered mental status, unspecified altered mental status type [R41.82] Anemia, unspecified type [D64.9]   Principal Problem:   Aspiration pneumonia (HCC) Active Problems:   Alzheimer's dementia with behavioral disturbance   Anemia   Goals of care, counseling/discussion   Palliative care by specialist      Past Medical History:  Diagnosis Date  . Alzheimer's dementia   . Frequent headaches   . GERD (gastroesophageal reflux disease)     No past surgical history on file.     History of present illness and  Hospital Course:     Kindly see H&P for history of present illness and admission details, please review complete Labs, Consult reports and Test reports for all details in brief  HPI  from the history and physical done on the day of admission  HPI: Emily CaffeyCathy Lynch Curtis is a 67 y.o. female with medical history significant of dementia (almost nonverbal, eats, drinks, and paces).  She is unaccompanied and was unable to provide history at the time of my evaluation.  HPI per PA Sofia: Pt reported to be less responsive than usual. Pt ate breakfast and had a small amount of vomiting. Pt was in the car for an extended period of time and may have become hot per nephew. He reports pt usually paces. Pt not pacing today.   She was admitted from  4/10-12 for probable vasovagal syncope with anemia, hgb 7.1 s/p 1 unit PRBC.    ED Course:  Choked and vomited a small amount and then later with an episode where she was not very responsive to family.  EMS gave IVF.  In ER, sleepy but arousable.  CXR with PNA.       Hospital Course  67 y.o.femalewith medical history significant ofdementia (almost nonverbal, eats, drinks, and paces). Family reported patient to be less responsive than usual. Pt ate breakfast and had a small amount of vomiting. Pt was in the car for an extended period of time and may have become hot per nephew.   She waslastadmitted from 4/10-12 for probable vasovagal syncope with anemia, hgb 7.1 s/p 1 unit PRBC.Chest x ray was indicative for RML thought to be aspiration  Her current admission is also for pneumonia, felt to be related to aspiration. Seen by speech therapy and no significant change in diet was recommended. She is currently on antibiotics. Family appears to be having a difficult time providing adequate care at home. Lack of insurance is also made this challenging. Social work  is working with [facility] to see if there is a possibility that the patient can be placed in a nursing home.  Unfortunately she was unable to be placed at a facility.     Aspiration pneumonia. PCN allergy--patient started on Levaquin in the emergency room. This was continued on admission. Clinically she appears to be improving. blood cultures have shown no growth. seen by speech and given regular diet with no signs of aspiration, to finish another 2 days as an outpatient  Severe alzheimer's dementia. palliative care following, not felt to be a candidate for hospice at this time. 3-4 recent ER/hospitalizations within past 6 months. most present with hypotension from poor PO intake. patient virtually non-verbal     Discharge Condition:  Stable   Follow UP      Discharge Instructions  and  Discharge  Medications    Discharge Instructions    Discharge instructions   Complete by:  As directed    Follow with Primary MD [place], Betty G, MD in 7 days   Get CBC, CMP, 2 view Chest X ray checked  by Primary MD next visit.    Activity: As tolerated with Full fall precautions use walker/cane & assistance as needed   Disposition Home    Diet: Regular diet , with feeding assistance and aspiration precautions.  For Heart failure patients - Check your Weight same time everyday, if you gain over 2 pounds, or you develop in leg swelling, experience more shortness of breath or chest pain, call your Primary MD immediately. Follow Cardiac Low Salt Diet and 1.5 lit/day fluid restriction.   On your next visit with your primary care physician please Get Medicines reviewed and adjusted.   Please request your Prim.MD to go over all Hospital Tests and Procedure/Radiological results at the follow up, please get all Hospital records sent to your Prim MD by signing hospital release before you go home.   If you experience worsening of your admission symptoms, develop shortness of breath, life threatening emergency, suicidal or homicidal thoughts you must seek medical attention immediately by calling 911 or calling your MD immediately  if symptoms less severe.  You Must read complete instructions/literature along with all the possible adverse reactions/side effects for all the Medicines you take and that have been prescribed to you. Take any new Medicines after you have completely understood and accpet all the possible adverse reactions/side effects.   Do not drive, operating heavy machinery, perform activities at heights, swimming or participation in water activities or provide baby sitting services if your were admitted for syncope or siezures until you have seen by Primary MD or a Neurologist and advised to do so again.  Do not drive when taking Pain medications.    Do not take more than prescribed  Pain, Sleep and Anxiety Medications  Special Instructions: If you have smoked or chewed Tobacco  in the last 2 yrs please stop smoking, stop any regular Alcohol  and or any Recreational drug use.  Wear Seat belts while driving.   Please note  You were cared for by a hospitalist during your hospital stay. If you have any questions about your discharge medications or the care you received while you were in the hospital after you are discharged, you can call the unit and asked to speak with the hospitalist on call if the hospitalist that took care of you is not available. Once you are discharged, your primary care physician will handle any further medical issues. Please note that  NO REFILLS for any discharge medications will be authorized once you are discharged, as it is imperative that you return to your primary care physician (or establish a relationship with a primary care physician if you do not have one) for your aftercare needs so that they can reassess your need for medications and monitor your lab values.   Increase activity slowly   Complete by:  As directed      Allergies as of 03/18/2018      Reactions   Penicillins Hives, Other (See Comments)   Has patient had a PCN reaction causing immediate rash, facial/tongue/throat swelling, SOB or lightheadedness with hypotension: Yes Has patient had a PCN reaction causing severe rash involving mucus membranes or skin necrosis: Unk Has patient had a PCN reaction that required hospitalization: No Has patient had a PCN reaction occurring within the last 10 years: No If all of the above answers are "NO", then may proceed with Cephalosporin use.      Medication List    TAKE these medications   acetaminophen 325 MG tablet Commonly known as:  TYLENOL Take 325-650 mg by mouth every 6 (six) hours as needed (for headaches).   cetirizine 10 MG tablet Commonly known as:  ZYRTEC Take 10 mg by mouth daily.   donepezil 10 MG tablet Commonly known  as:  ARICEPT Take 10 mg at bedtime   ferrous sulfate 325 (65 FE) MG tablet Take 1 tablet (325 mg total) by mouth daily with breakfast.   folic acid 1 MG tablet Commonly known as:  FOLVITE Take 1 tablet (1 mg total) by mouth daily.   levofloxacin 750 MG tablet Commonly known as:  LEVAQUIN Take 1 tablet (750 mg total) by mouth daily at 6 PM.   memantine 10 MG tablet Commonly known as:  NAMENDA Take 10 mg by mouth 2 (two) times daily.   NASACORT ALLERGY 24HR 55 MCG/ACT Aero nasal inhaler Generic drug:  triamcinolone Place 2 sprays into the nose daily as needed (for seasonal allergies).   pantoprazole 20 MG tablet Commonly known as:  PROTONIX Take 1 tablet (20 mg total) by mouth daily.   ranitidine 150 MG tablet Commonly known as:  ZANTAC Take 150 mg by mouth 2 (two) times daily as needed for heartburn.   vitamin B-12 1000 MCG tablet Commonly known as:  CYANOCOBALAMIN Take 1 tablet (1,000 mcg total) by mouth daily.   Vitamin D (Ergocalciferol) 50000 units Caps capsule Commonly known as:  DRISDOL Take 1 capsule (50,000 Units total) by mouth once a week. FOR 16 WEEKS AND THEN ONCE A WEEK EVERY 2 WEEKS         Diet and Activity recommendation: See Discharge Instructions above   Consults obtained -  None   Major procedures and Radiology Reports - PLEASE review detailed and final reports for all details, in brief -    Dg Chest 2 View  Result Date: 03/13/2018 CLINICAL DATA:  Altered mental status. EXAM: CHEST - 2 VIEW COMPARISON:  03/05/2018.  11/28/2017. FINDINGS: Heart size normal. Low lung volumes with mild basilar atelectasis. Mild right perihilar/right middle lobe infiltrate. No pleural effusion or pneumothorax. Degenerative changes and scoliosis thoracic spine. Surgical clips right upper quadrant. IMPRESSION: Low lung volumes with mild basilar atelectasis. Mild right perihilar/right middle lobe infiltrate. Electronically Signed   By: Maisie Fus  Register   On:  03/13/2018 13:13   Dg Chest 2 View  Result Date: 03/05/2018 CLINICAL DATA:  Transient hypotension while at [facility]. EXAM: CHEST - 2 VIEW  COMPARISON:  Radiograph 11/28/2017 FINDINGS: Persistent low lung volumes. Unchanged heart size and mediastinal contours with mild cardiomegaly. Improved interstitial prominence from prior exam. Possible residual mild vascular congestion. No confluent airspace disease, pleural effusion or pneumothorax. No acute osseous abnormalities. IMPRESSION: Low lung volumes. Chronic cardiomegaly. Improved pulmonary vasculature from prior with possible mild residual vascular congestion. Electronically Signed   By: Rubye Oaks M.D.   On: 03/05/2018 21:41    Micro Results    Recent Results (from the past 240 hour(s))  Blood culture (routine x 2)     Status: None   Collection Time: 03/13/18  6:00 PM  Result Value Ref Range Status   Specimen Description BLOOD RIGHT HAND  Final   Special Requests   Final    BOTTLES DRAWN AEROBIC AND ANAEROBIC Blood Culture adequate volume   Culture   Final    NO GROWTH 5 DAYS Performed at [facility], [address] [place], [place] [address]    Report Status 03/18/2018 FINAL  Final  Blood culture (routine x 2)     Status: None   Collection Time: 03/13/18  6:20 PM  Result Value Ref Range Status   Specimen Description BLOOD LEFT HAND  Final   Special Requests   Final    BOTTLES DRAWN AEROBIC AND ANAEROBIC Blood Culture adequate volume   Culture   Final    NO GROWTH 5 DAYS Performed at [facility], [address], [place], [place] [address]    Report Status 03/18/2018 FINAL  Final       Today   Subjective:   Emily Curtis today unable to provide any complaints, no significant events overnight .  Objective:   Blood pressure 131/70, pulse 80, temperature 98.7 F (37.1 C), temperature source Oral, resp. rate 16, weight 76.5 kg (168 lb 10.4 oz), SpO2 99 %.   Intake/Output Summary (Last 24 hours) at 03/18/2018  1555 Last data filed at 03/18/2018 1000 Gross per 24 hour  Intake 1160 ml  Output 1100 ml  Net 60 ml    Exam  Awake, demented, nonverbal, sitting in bed eating breakfast with Aide assistance Good air entry bilaterally, clear to auscultation Regular rate and rhythm, no rubs murmurs gallops Abdomen soft, nontender, nondistended, bowel sounds present Limited with no edema, clubbing or cyanosis      Data Review   CBC w Diff:  Lab Results  Component Value Date   WBC 8.6 03/16/2018   HGB 11.1 (L) 03/16/2018   HCT 34.2 (L) 03/16/2018   PLT 206 03/16/2018   LYMPHOPCT 12 03/14/2018   MONOPCT 3 03/14/2018   EOSPCT 0 03/14/2018   BASOPCT 0 03/14/2018    CMP:  Lab Results  Component Value Date   NA 134 (L) 03/16/2018   K 3.5 03/16/2018   CL 105 03/16/2018   CO2 20 (L) 03/16/2018   BUN 8 03/16/2018   CREATININE 0.63 03/16/2018   PROT 6.5 03/13/2018   ALBUMIN 3.4 (L) 03/13/2018   BILITOT 0.5 03/13/2018   ALKPHOS 100 03/13/2018   AST 20 03/13/2018   ALT 10 (L) 03/13/2018  .   Total Time in preparing paper work, data evaluation and todays exam - 30 minutes  Huey Bienenstock M.D on 03/18/2018 at 3:55 PM  [facility]   Office  [phone]

## 2018-03-18 NOTE — Social Work (Addendum)
CSW spoke with assistant director Emily Curtis, reviewed clinicals for pt. Pt is not able to be discharged under 30 day LOG at this time. Unsure if pt is able to qualify for home health services, CSW has reached out to RN Case Manager covering pt room.   CSW f/u with pt sister, pt sister aware that pt is not able to receive placement at this time, pt sister states she is still attempting to get Medicaid, has papers "being mailed to my house to fill out for her." Pt nephew will transport pt back home at discharge.   3:50pm- Per RN Case Manger note during previous admission, [facility] based on [facility] assessment.   CSW signing off. Please consult if any additional needs arise.  Emily Curtis, LCSWA [facility] Clinical Social Work [phone]

## 2018-03-18 NOTE — Discharge Instructions (Signed)
Follow with Primary MD [name], Emily G, MD in 7 days   Get CBC, CMP, 2 view Chest X ray checked  by Primary MD next visit.    Activity: As tolerated with Full fall precautions use walker/cane & assistance as needed   Disposition Home    Diet: Regular diet , with feeding assistance and aspiration precautions.  For Heart failure patients - Check your Weight same time everyday, if you gain over 2 pounds, or you develop in leg swelling, experience more shortness of breath or chest pain, call your Primary MD immediately. Follow Cardiac Low Salt Diet and 1.5 lit/day fluid restriction.   On your next visit with your primary care physician please Get Medicines reviewed and adjusted.   Please request your Prim.MD to go over all Hospital Tests and Procedure/Radiological results at the follow up, please get all Hospital records sent to your Prim MD by signing hospital release before you go home.   If you experience worsening of your admission symptoms, develop shortness of breath, life threatening emergency, suicidal or homicidal thoughts you must seek medical attention immediately by calling 911 or calling your MD immediately  if symptoms less severe.  You Must read complete instructions/literature along with all the possible adverse reactions/side effects for all the Medicines you take and that have been prescribed to you. Take any new Medicines after you have completely understood and accpet all the possible adverse reactions/side effects.   Do not drive, operating heavy machinery, perform activities at heights, swimming or participation in water activities or provide baby sitting services if your were admitted for syncope or siezures until you have seen by Primary MD or a Neurologist and advised to do so again.  Do not drive when taking Pain medications.    Do not take more than prescribed Pain, Sleep and Anxiety Medications  Special Instructions: If you have smoked or chewed Tobacco  in  the last 2 yrs please stop smoking, stop any regular Alcohol  and or any Recreational drug use.  Wear Seat belts while driving.   Please note  You were cared for by a hospitalist during your hospital stay. If you have any questions about your discharge medications or the care you received while you were in the hospital after you are discharged, you can call the unit and asked to speak with the hospitalist on call if the hospitalist that took care of you is not available. Once you are discharged, your primary care physician will handle any further medical issues. Please note that NO REFILLS for any discharge medications will be authorized once you are discharged, as it is imperative that you return to your primary care physician (or establish a relationship with a primary care physician if you do not have one) for your aftercare needs so that they can reassess your need for medications and monitor your lab values.

## 2018-03-25 ENCOUNTER — Ambulatory Visit: Payer: Self-pay | Admitting: Nurse Practitioner

## 2018-04-02 ENCOUNTER — Ambulatory Visit: Payer: Self-pay | Admitting: Neurology

## 2018-04-07 ENCOUNTER — Telehealth: Payer: Self-pay | Admitting: Family Medicine

## 2018-04-07 NOTE — Telephone Encounter (Signed)
Copied from CRM [id number]. Topic: Inquiry >> Apr 07, 2018  2:22 PM Yvonna Alanis wrote: Reason for CRM: Kathrin Greathouse with [facility] [phone]) called to inquire about a FL2 form our office was to fax back to her last week concerning this patient. Westly Pam spoke with someone in the office on Friday that stated that Dr. [place] had to document in her hand as they were speaking and that they were faxing it over to [facility].  Westly Pam states she never received this document. Please call Westly Pam at [phone], Fax # [phone].      Thank You!!!

## 2018-04-08 NOTE — Telephone Encounter (Signed)
Westly Pam calling back because the facility is requesting an "H and P" or recent physician notes, maybe the last 3, to assist with placement? Please send this information to the same fax that the [facility] was sent to.   ATTNKathrin Greathouse

## 2018-04-08 NOTE — Telephone Encounter (Signed)
Spoke with Westly Pam and informed her that I was getting ready to fax over FL2 form, she confirmed correct fax number. Faxed sent with confirmation page.

## 2018-04-08 NOTE — Telephone Encounter (Signed)
Last 3 office notes faxed to [facility] as requested.

## 2019-02-26 IMAGING — CT CT HEAD W/O CM
3 of 4 series · 15 of 47 positions shown, 18 images · non-contrast
Comparison: Brain CT 11/28/2017.

CLINICAL DATA: Patient with syncopal episode. History of
dehydration.

EXAM:
CT HEAD WITHOUT CONTRAST
TECHNIQUE: Contiguous axial images were obtained from the base of the skull
through the vertex without intravenous contrast.

[Series 4: head 2.0 h70h · axial · 0.46mm/px · z∈[-97,+33]mm · 9 of 81 slices shown, 12 images]
[im 8/81  brain]
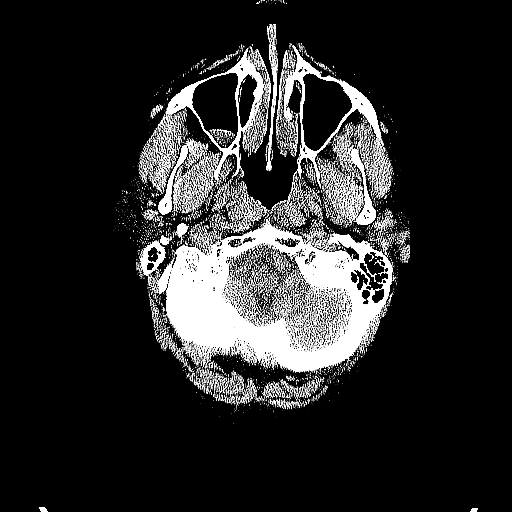
[im 8/81  bone]
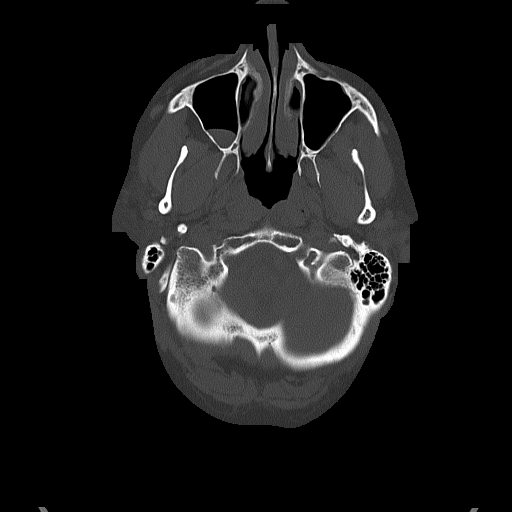
[im 16/81  brain]
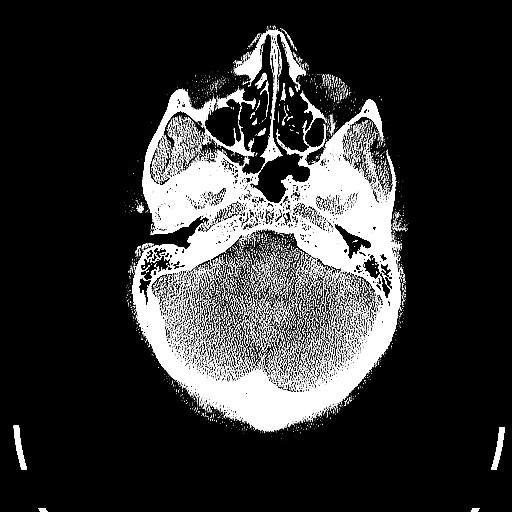
[im 23/81  brain]
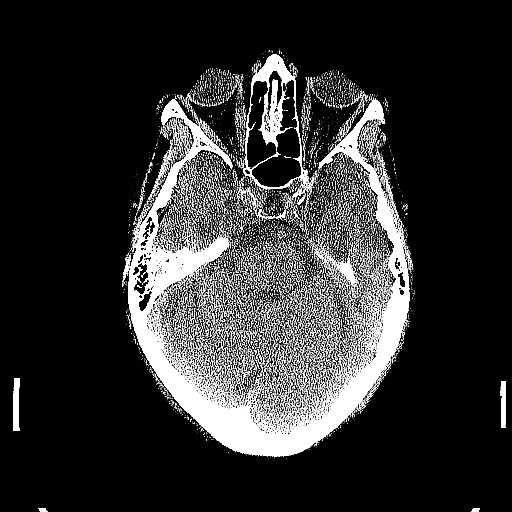
[im 31/81  brain]
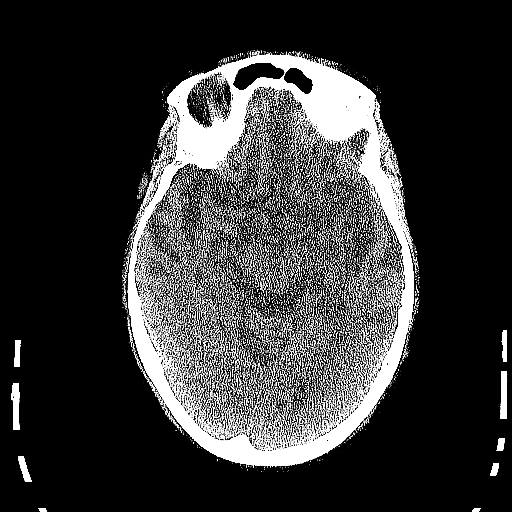
[im 42/81  brain]
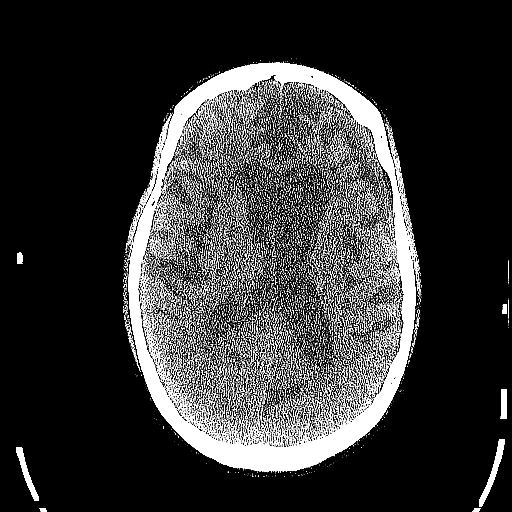
[im 42/81  bone]
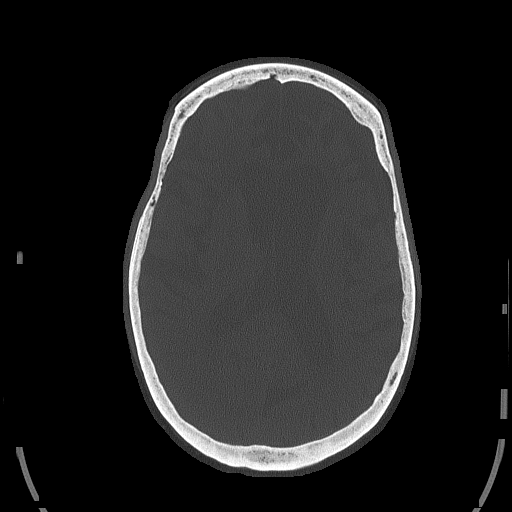
[im 50/81  brain]
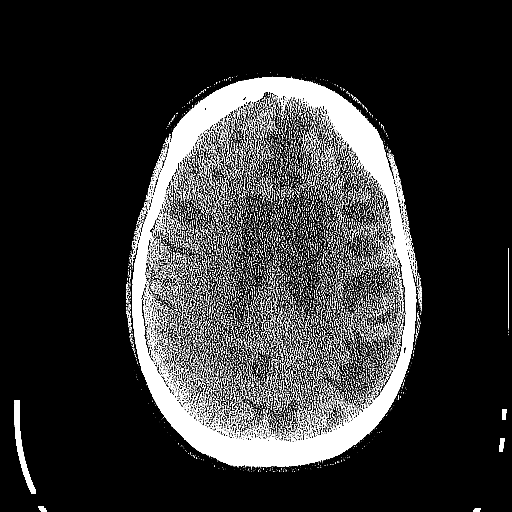
[im 58/81  brain]
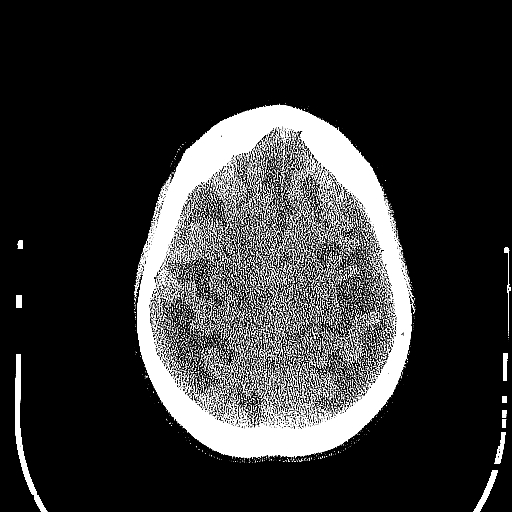
[im 65/81  brain]
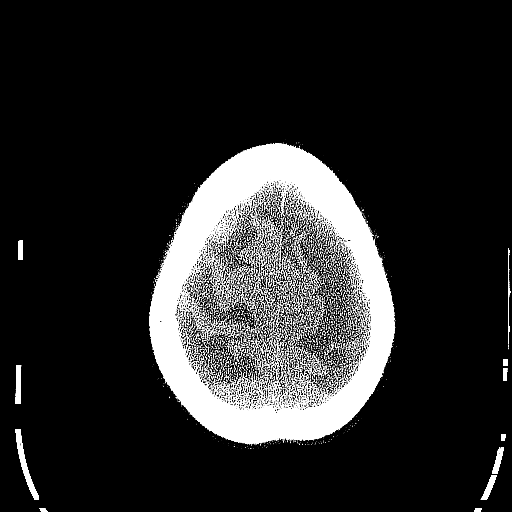
[im 73/81  brain]
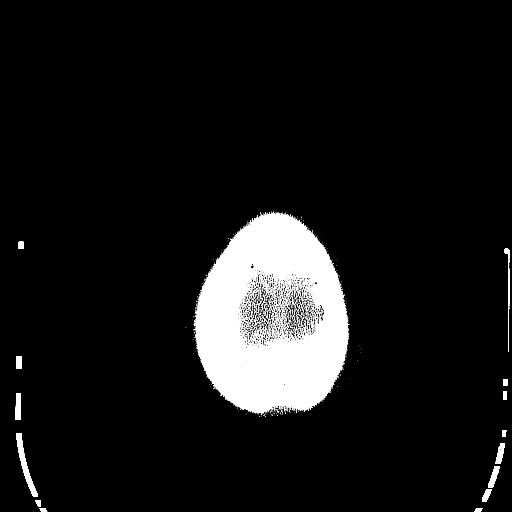
[im 73/81  bone]
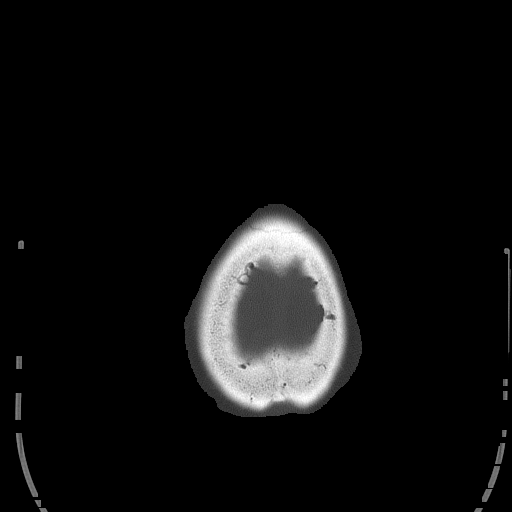

[Series 5: head 3.0 mpr cor · coronal · 0.32mm/px · 3 of 73 slices shown]
[im 25/73  brain]
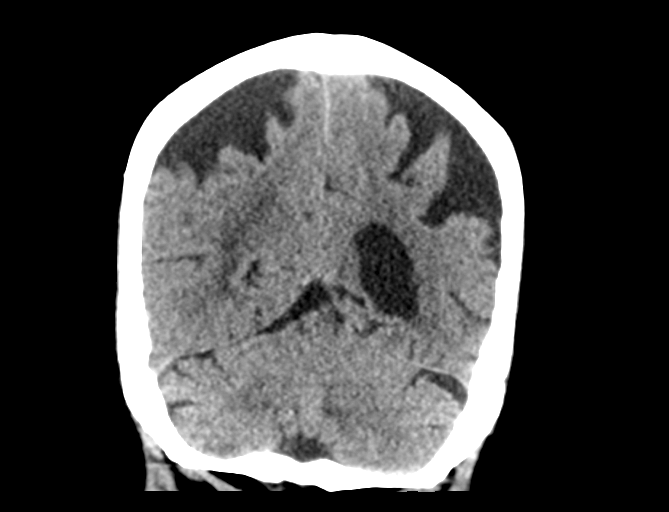
[im 33/73  brain]
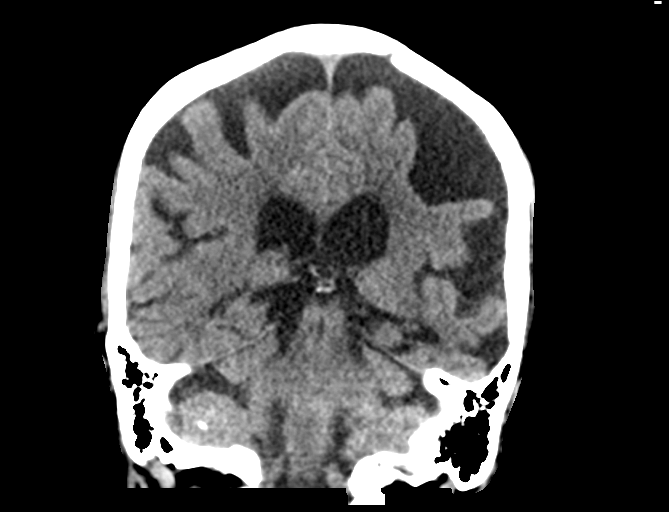
[im 41/73  brain]
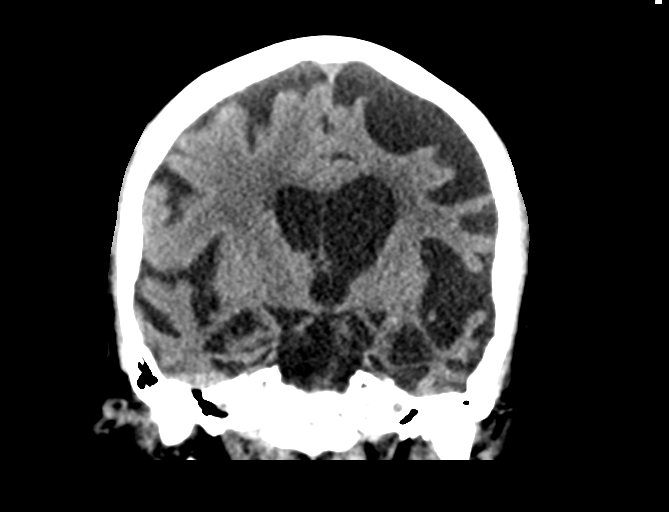

[Series 6: head 3.0 mpr sag · sagittal · 0.35mm/px · 3 of 56 slices shown]
[im 19/56  brain]
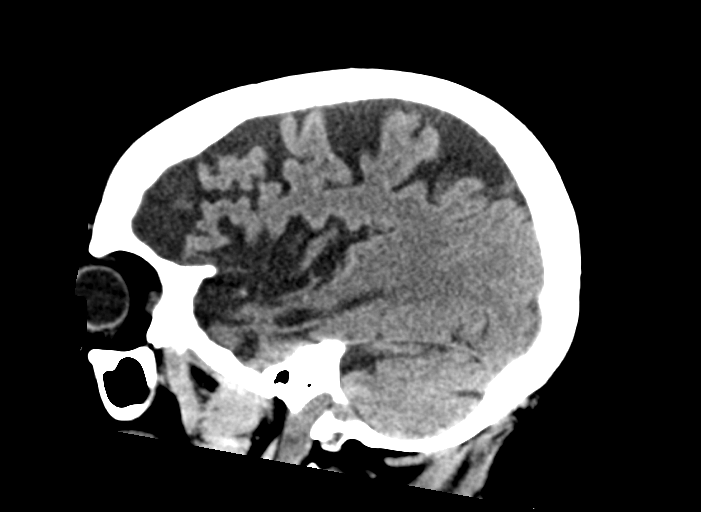
[im 28/56  brain]
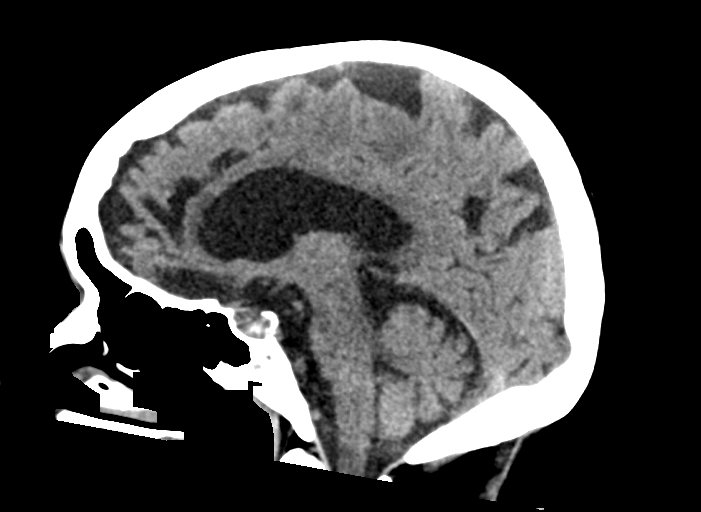
[im 37/56  brain]
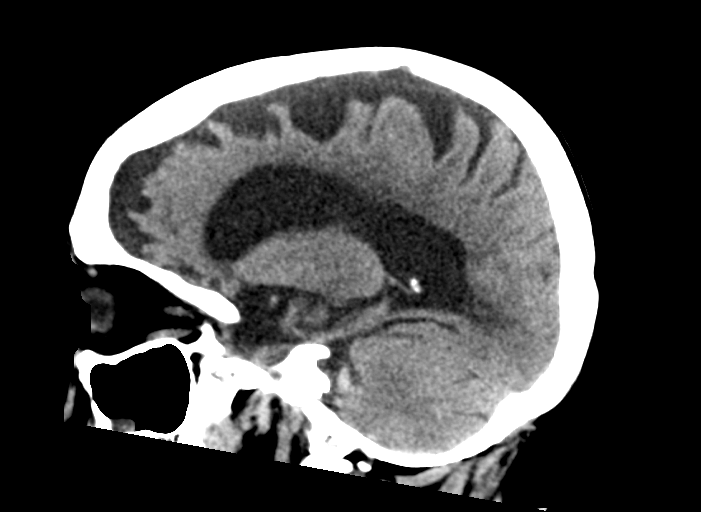

[15 of 47 positions shown; findings below may reference images not displayed]

FINDINGS: Brain: No acute cortically based infarct, intracranial hemorrhage,
mass lesion or mass-effect. Atrophy and chronic microvascular
ischemic changes

Vascular: No atherosclerotic change.

Skull: Normal. Negative for fracture or focal lesion.

Sinuses/Orbits: Maxillary sinus mucosal thickening.

Other: None.
IMPRESSION: No acute intracranialprocess.

Atrophy and chronic microvascular ischemic change.

## 2019-04-06 IMAGING — CR DG CHEST 2V
2 series · 2 of 2 positions shown · non-contrast
Comparison: Radiograph 11/28/2017

CLINICAL DATA: Transient hypotension while at [facility].

EXAM:
CHEST - 2 VIEW

[chest lat]
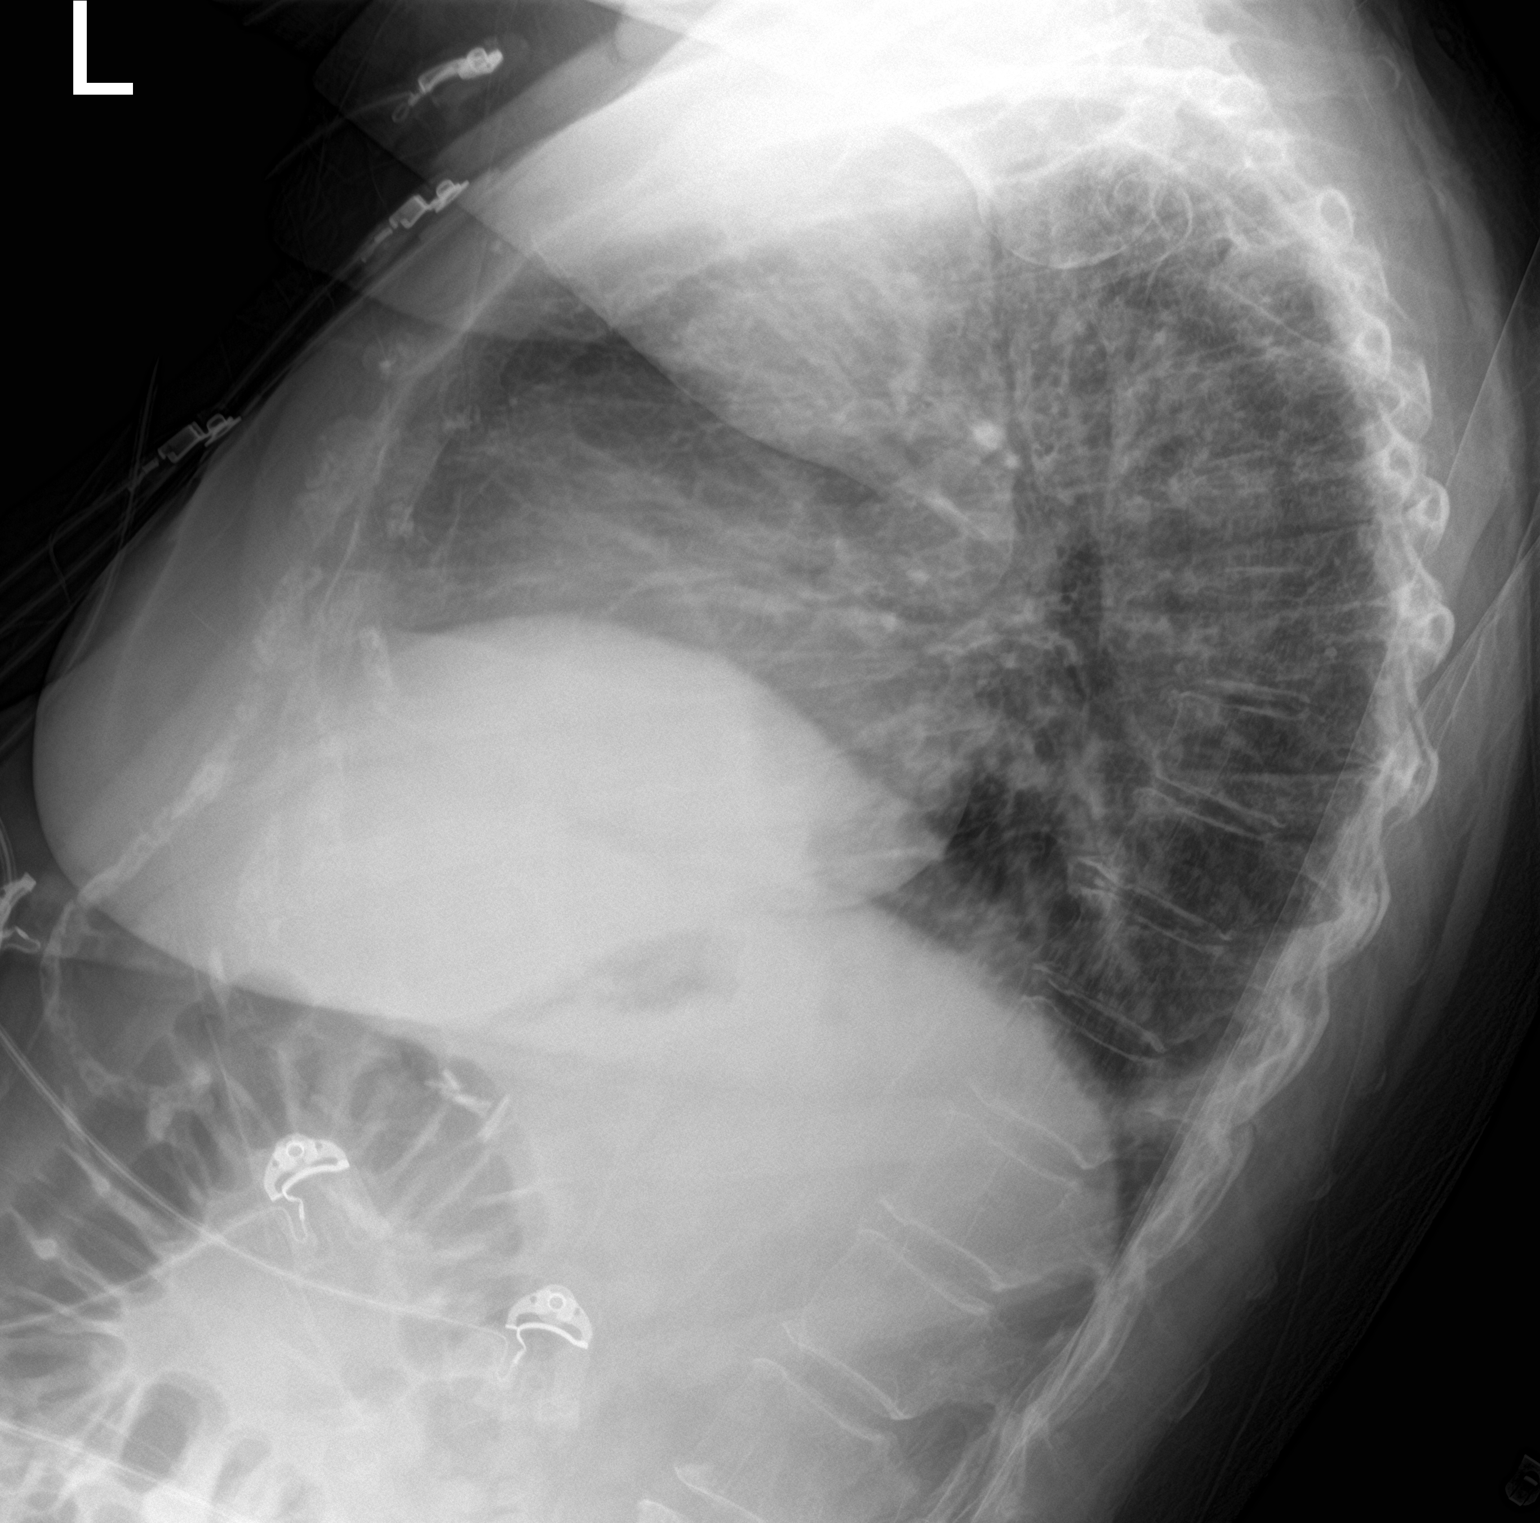

[chest ap]
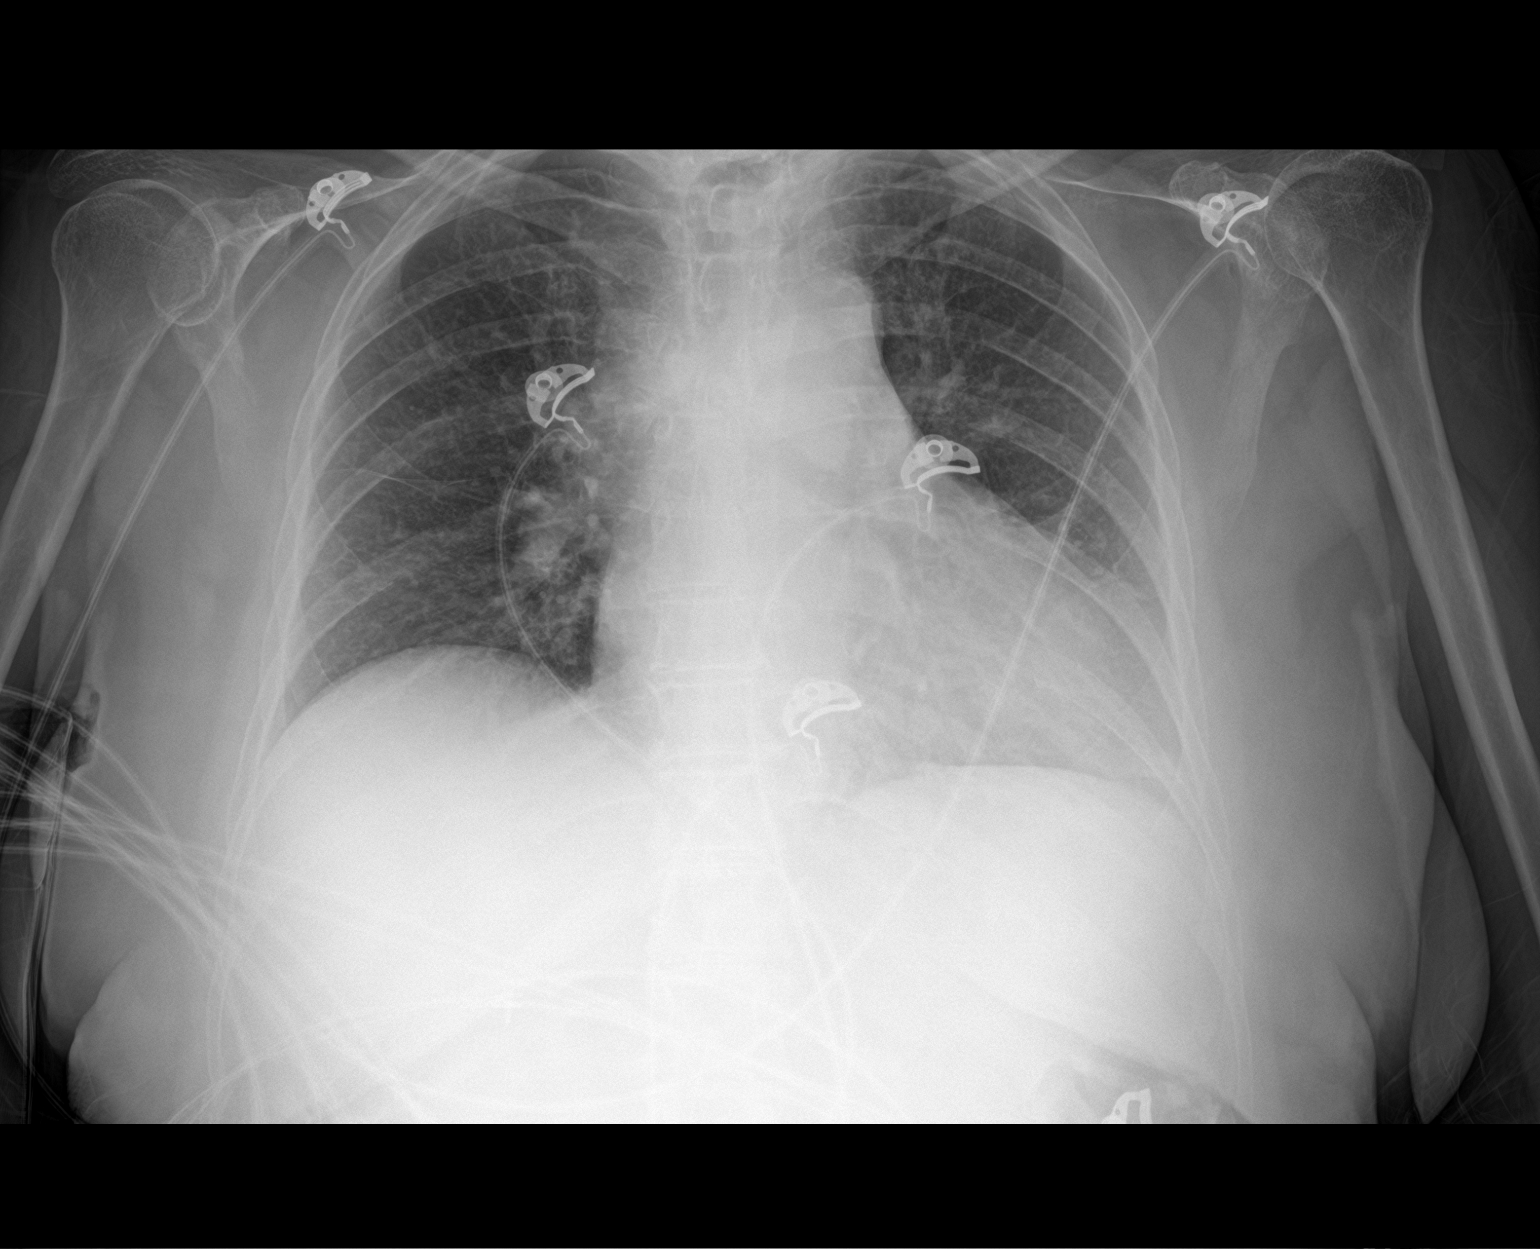

[2 of 2 positions shown; findings below may reference images not displayed]

FINDINGS: Persistent low lung volumes. Unchanged heart size and mediastinal
contours with mild cardiomegaly. Improved interstitial prominence
from prior exam. Possible residual mild vascular congestion. No
confluent airspace disease, pleural effusion or pneumothorax. No
acute osseous abnormalities.
IMPRESSION: Low lung volumes. Chronic cardiomegaly. Improved pulmonary
vasculature from prior with possible mild residual vascular
congestion.

## 2019-04-14 IMAGING — CR DG CHEST 2V
2 series · 2 of 2 positions shown · non-contrast
Comparison: 03/05/2018.  11/28/2017.

CLINICAL DATA: Altered mental status.

EXAM:
CHEST - 2 VIEW

[chest lat]
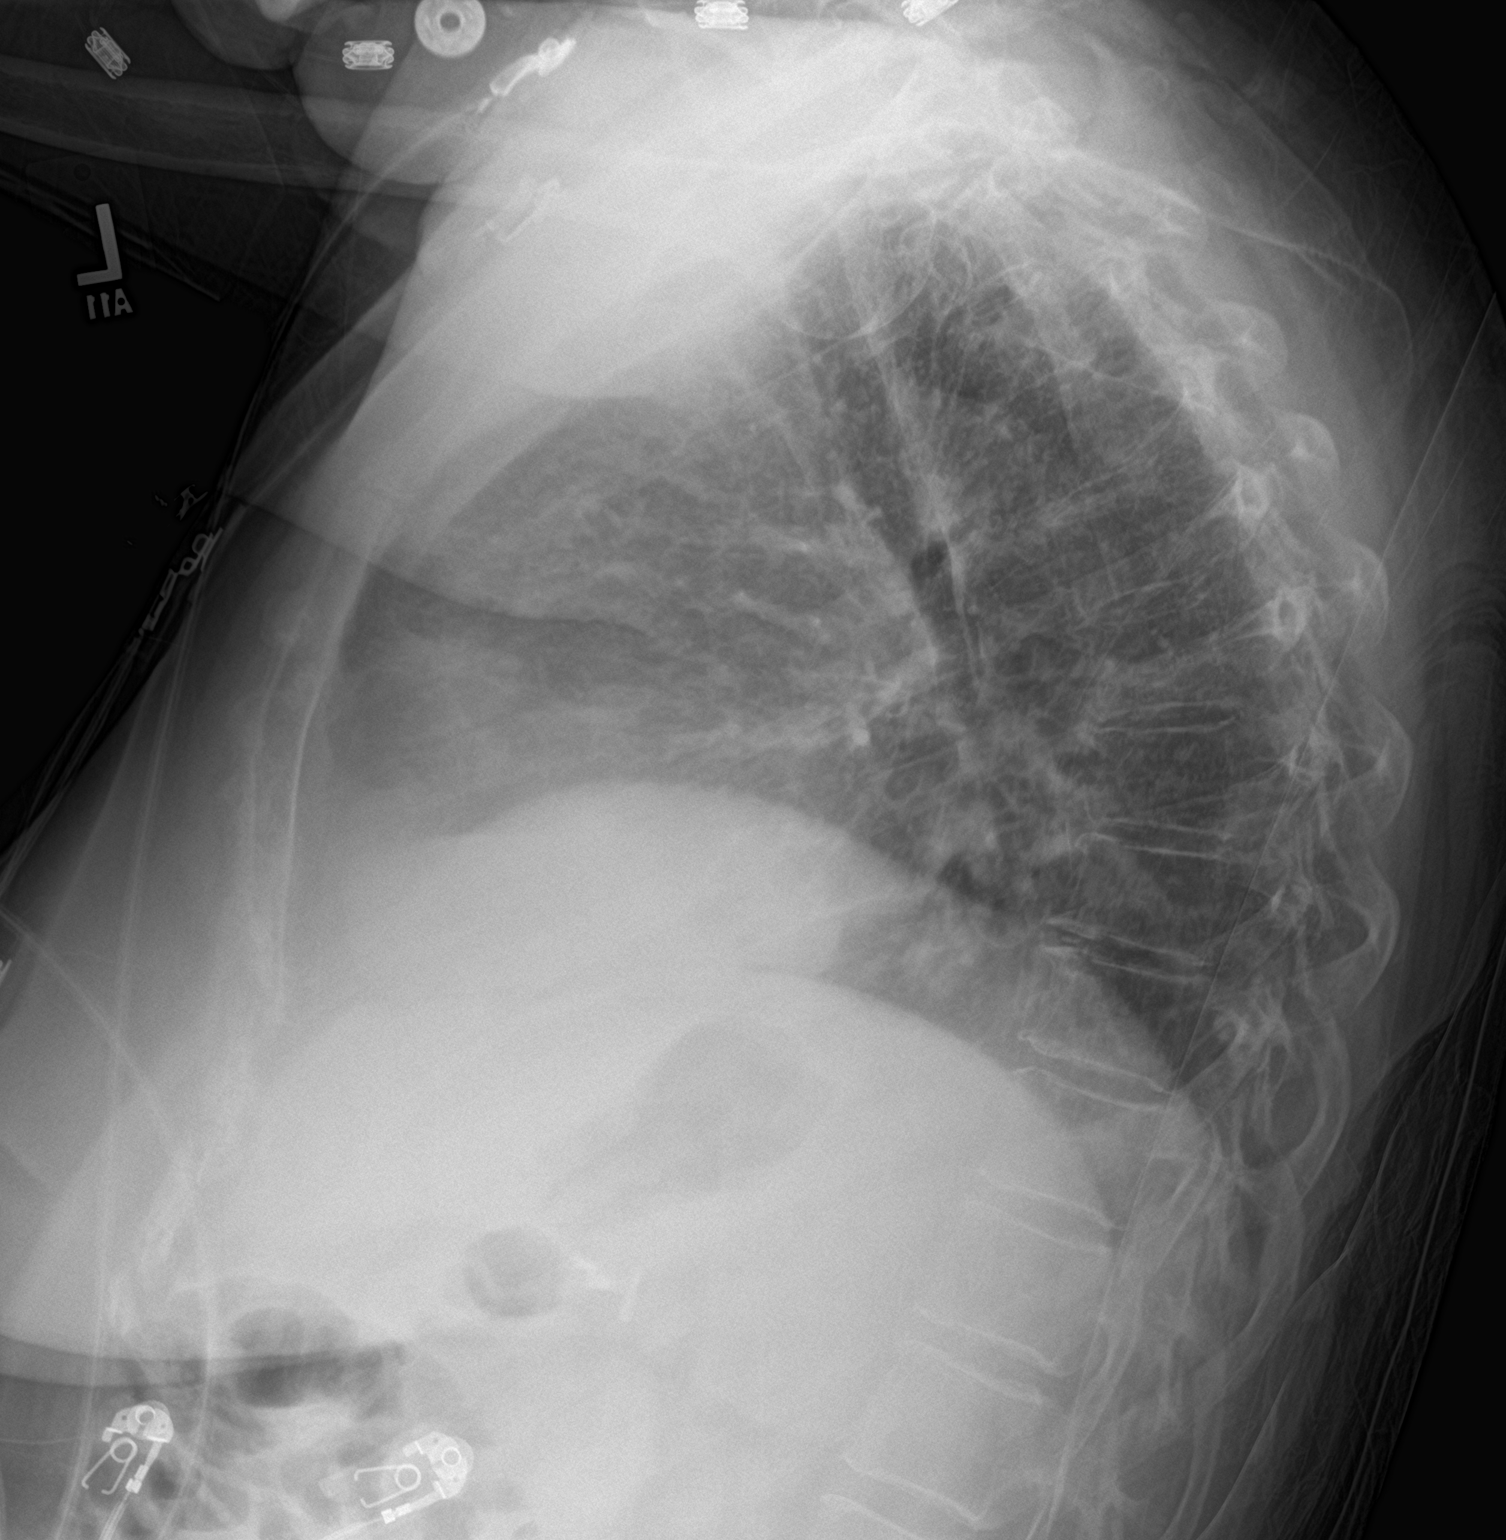

[chest ap]
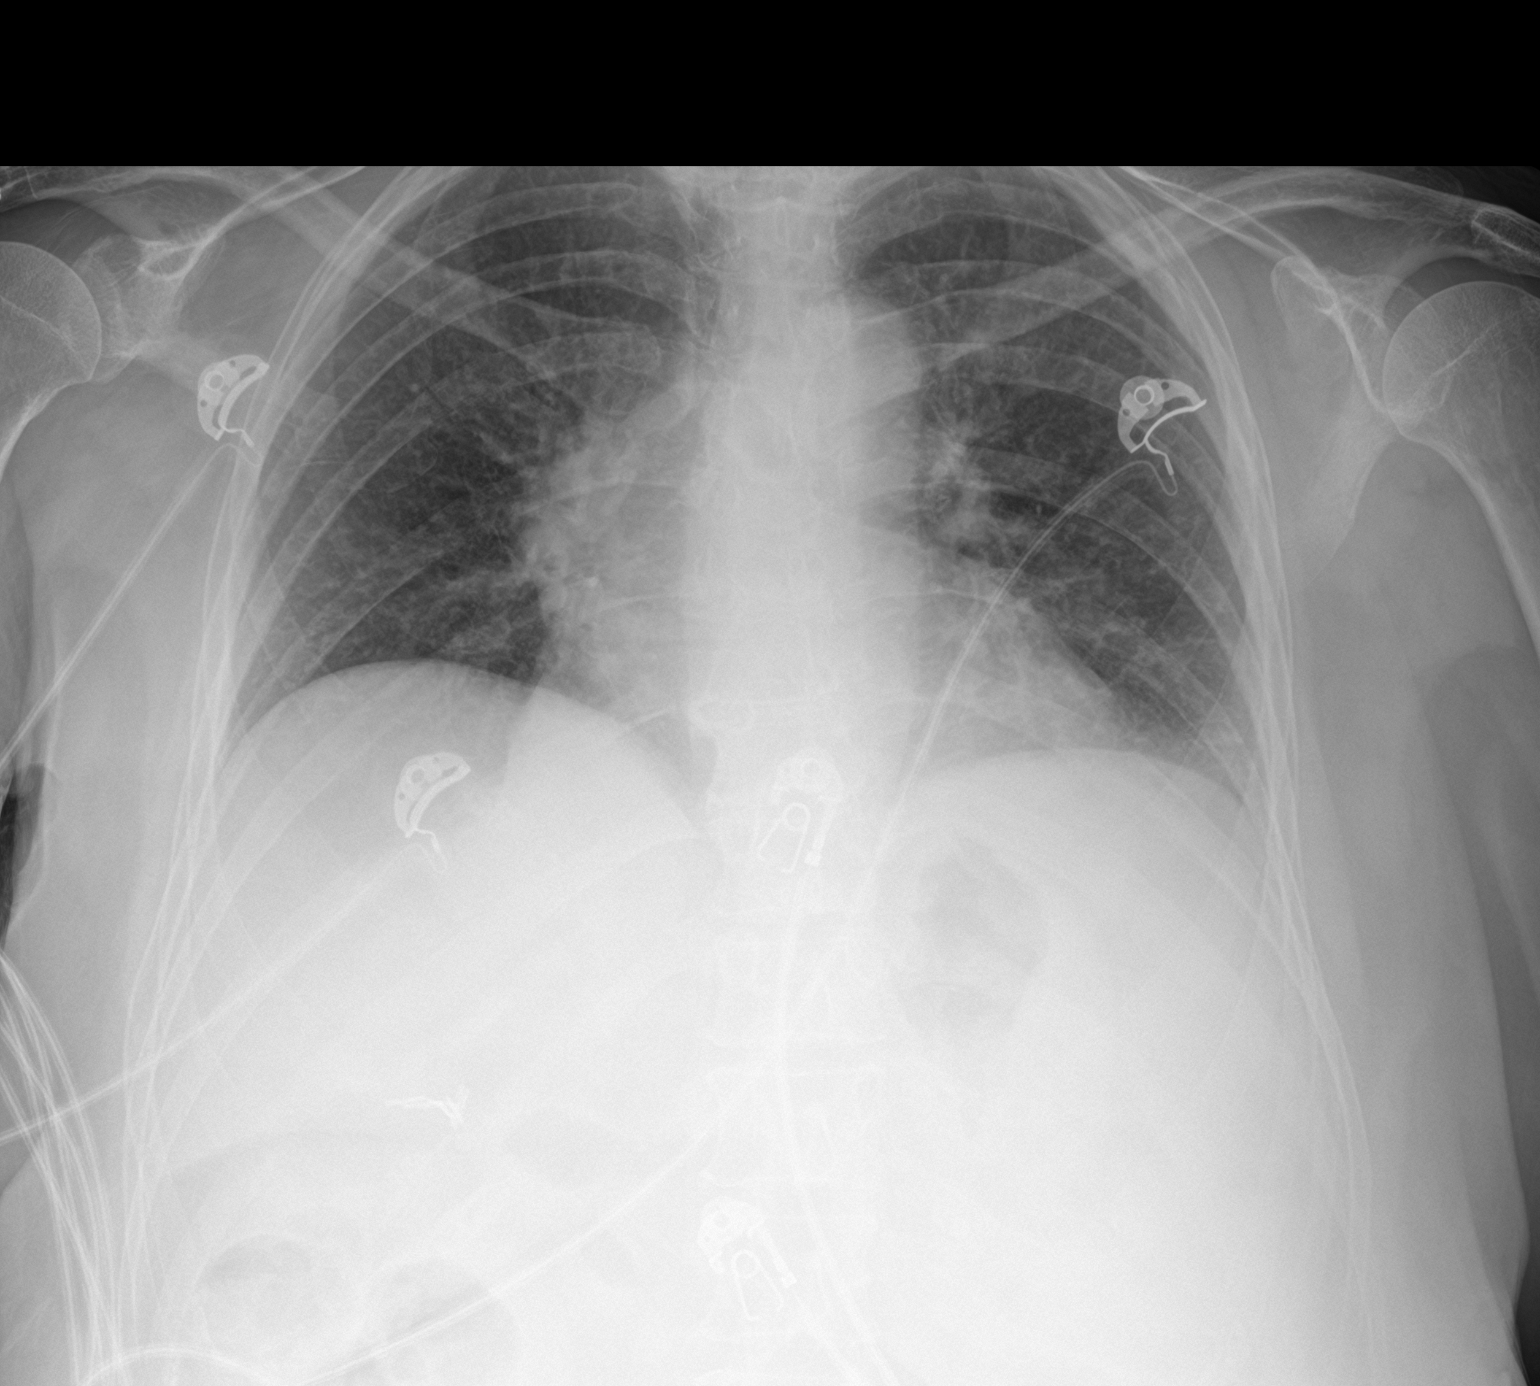

[2 of 2 positions shown; findings below may reference images not displayed]

FINDINGS: Heart size normal. Low lung volumes with mild basilar atelectasis.
Mild right perihilar/right middle lobe infiltrate. No pleural
effusion or pneumothorax. Degenerative changes and scoliosis
thoracic spine. Surgical clips right upper quadrant.
IMPRESSION: Low lung volumes with mild basilar atelectasis. Mild right
perihilar/right middle lobe infiltrate.

## 2020-10-26 DEATH — deceased
